# Patient Record
Sex: Male | Born: 1975 | Race: White | Hispanic: No | Marital: Single | Smoking: Never smoker
Health system: Southern US, Community
[De-identification: ages and names within clinical notes are randomized; demographics above are authoritative.]

## PROBLEM LIST (undated history)

## (undated) DIAGNOSIS — J45909 Unspecified asthma, uncomplicated: Secondary | ICD-10-CM

---

## 2016-12-22 DIAGNOSIS — J45901 Unspecified asthma with (acute) exacerbation: Secondary | ICD-10-CM

## 2016-12-22 DIAGNOSIS — E876 Hypokalemia: Secondary | ICD-10-CM

## 2016-12-22 DIAGNOSIS — R0600 Dyspnea, unspecified: Secondary | ICD-10-CM

## 2016-12-22 DIAGNOSIS — R0902 Hypoxemia: Secondary | ICD-10-CM

## 2017-09-14 DIAGNOSIS — J9601 Acute respiratory failure with hypoxia: Secondary | ICD-10-CM

## 2017-09-14 DIAGNOSIS — J45901 Unspecified asthma with (acute) exacerbation: Secondary | ICD-10-CM

## 2017-09-15 DIAGNOSIS — Z720 Tobacco use: Secondary | ICD-10-CM

## 2017-10-15 ENCOUNTER — Emergency Department (HOSPITAL_COMMUNITY): Payer: Self-pay

## 2017-10-15 ENCOUNTER — Encounter (HOSPITAL_COMMUNITY): Payer: Self-pay | Admitting: Emergency Medicine

## 2017-10-15 ENCOUNTER — Inpatient Hospital Stay (HOSPITAL_COMMUNITY)
Admission: EM | Admit: 2017-10-15 | Discharge: 2017-10-17 | DRG: 202 | Disposition: A | Discharge status: Home / Self Care | Payer: Self-pay | Attending: Family Medicine | Admitting: Family Medicine

## 2017-10-15 ENCOUNTER — Other Ambulatory Visit: Payer: Self-pay

## 2017-10-15 DIAGNOSIS — J069 Acute upper respiratory infection, unspecified: Secondary | ICD-10-CM | POA: Diagnosis present

## 2017-10-15 DIAGNOSIS — E872 Acidosis: Secondary | ICD-10-CM | POA: Diagnosis present

## 2017-10-15 DIAGNOSIS — J9602 Acute respiratory failure with hypercapnia: Secondary | ICD-10-CM | POA: Diagnosis present

## 2017-10-15 DIAGNOSIS — K469 Unspecified abdominal hernia without obstruction or gangrene: Secondary | ICD-10-CM | POA: Diagnosis present

## 2017-10-15 DIAGNOSIS — R000 Tachycardia, unspecified: Secondary | ICD-10-CM | POA: Diagnosis present

## 2017-10-15 DIAGNOSIS — J45901 Unspecified asthma with (acute) exacerbation: Principal | ICD-10-CM | POA: Diagnosis present

## 2017-10-15 DIAGNOSIS — T486X5A Adverse effect of antiasthmatics, initial encounter: Secondary | ICD-10-CM | POA: Diagnosis present

## 2017-10-15 DIAGNOSIS — F1110 Opioid abuse, uncomplicated: Secondary | ICD-10-CM | POA: Diagnosis present

## 2017-10-15 DIAGNOSIS — J9601 Acute respiratory failure with hypoxia: Secondary | ICD-10-CM | POA: Diagnosis present

## 2017-10-15 HISTORY — DX: Unspecified asthma, uncomplicated: J45.909

## 2017-10-15 LAB — CBC WITH DIFFERENTIAL/PLATELET
BASOS ABS: 0.1 10*3/uL (ref 0.0–0.1)
Basophils Relative: 1 %
EOS ABS: 0.8 10*3/uL — AB (ref 0.0–0.7)
EOS PCT: 6 %
HCT: 39.9 % (ref 39.0–52.0)
Hemoglobin: 13.3 g/dL (ref 13.0–17.0)
Lymphocytes Relative: 28 %
Lymphs Abs: 3.8 10*3/uL (ref 0.7–4.0)
MCH: 30.1 pg (ref 26.0–34.0)
MCHC: 33.3 g/dL (ref 30.0–36.0)
MCV: 90.3 fL (ref 78.0–100.0)
MONOS PCT: 7 %
Monocytes Absolute: 1 10*3/uL (ref 0.1–1.0)
Neutro Abs: 7.9 10*3/uL — ABNORMAL HIGH (ref 1.7–7.7)
Neutrophils Relative %: 58 %
PLATELETS: 274 10*3/uL (ref 150–400)
RBC: 4.42 MIL/uL (ref 4.22–5.81)
RDW: 13.1 % (ref 11.5–15.5)
WBC: 13.6 10*3/uL — AB (ref 4.0–10.5)

## 2017-10-15 LAB — RESPIRATORY PANEL BY PCR
Adenovirus: NOT DETECTED
BORDETELLA PERTUSSIS-RVPCR: NOT DETECTED
CORONAVIRUS 229E-RVPPCR: NOT DETECTED
Chlamydophila pneumoniae: NOT DETECTED
Coronavirus HKU1: NOT DETECTED
Coronavirus NL63: NOT DETECTED
Coronavirus OC43: NOT DETECTED
INFLUENZA B-RVPPCR: NOT DETECTED
Influenza A: NOT DETECTED
METAPNEUMOVIRUS-RVPPCR: NOT DETECTED
MYCOPLASMA PNEUMONIAE-RVPPCR: NOT DETECTED
Parainfluenza Virus 1: NOT DETECTED
Parainfluenza Virus 2: NOT DETECTED
Parainfluenza Virus 3: NOT DETECTED
Parainfluenza Virus 4: NOT DETECTED
RESPIRATORY SYNCYTIAL VIRUS-RVPPCR: NOT DETECTED
Rhinovirus / Enterovirus: NOT DETECTED

## 2017-10-15 LAB — BASIC METABOLIC PANEL
Anion gap: 9 (ref 5–15)
Anion gap: 11 (ref 5–15)
BUN: 9 mg/dL (ref 6–20)
BUN: 11 mg/dL (ref 6–20)
CHLORIDE: 101 mmol/L (ref 101–111)
CO2: 27 mmol/L (ref 22–32)
CO2: 26 mmol/L (ref 22–32)
CREATININE: 0.83 mg/dL (ref 0.61–1.24)
Calcium: 8.7 mg/dL — ABNORMAL LOW (ref 8.9–10.3)
Calcium: 8.9 mg/dL (ref 8.9–10.3)
Chloride: 102 mmol/L (ref 101–111)
Creatinine, Ser: 0.9 mg/dL (ref 0.61–1.24)
GFR calc Af Amer: >60 mL/min (ref >60)
GFR calc Af Amer: >60 mL/min (ref >60)
GFR calc non Af Amer: >60 mL/min (ref >60)
GFR calc non Af Amer: >60 mL/min (ref >60)
GLUCOSE: 201 mg/dL — AB (ref 65–99)
GLUCOSE: 187 mg/dL — AB (ref 65–99)
POTASSIUM: 5 mmol/L (ref 3.5–5.1)
POTASSIUM: 3.4 mmol/L — AB (ref 3.5–5.1)
Sodium: 138 mmol/L (ref 135–145)
Sodium: 138 mmol/L (ref 135–145)

## 2017-10-15 LAB — CBC
HEMATOCRIT: 37.4 % — AB (ref 39.0–52.0)
Hemoglobin: 12.3 g/dL — ABNORMAL LOW (ref 13.0–17.0)
MCH: 29.7 pg (ref 26.0–34.0)
MCHC: 32.9 g/dL (ref 30.0–36.0)
MCV: 90.3 fL (ref 78.0–100.0)
Platelets: 202 10*3/uL (ref 150–400)
RBC: 4.14 MIL/uL — ABNORMAL LOW (ref 4.22–5.81)
RDW: 13 % (ref 11.5–15.5)
WBC: 10.3 10*3/uL (ref 4.0–10.5)

## 2017-10-15 LAB — BLOOD GAS, VENOUS
Acid-base deficit: 1.6 mmol/L (ref 0.0–2.0)
BICARBONATE: 30.7 mmol/L — AB (ref 20.0–28.0)
O2 Content: 8 L/min
O2 Saturation: 78 %
PCO2 VEN: 96.7 mmHg — AB (ref 44.0–60.0)
PO2 VEN: 56 mmHg — AB (ref 32.0–45.0)
Patient temperature: 98.6
pH, Ven: 7.128 — CL (ref 7.250–7.430)

## 2017-10-15 LAB — BLOOD GAS, ARTERIAL
ACID-BASE EXCESS: 1.3 mmol/L (ref 0.0–2.0)
BICARBONATE: 26.8 mmol/L (ref 20.0–28.0)
DRAWN BY: 225631
Delivery systems: POSITIVE
Expiratory PAP: 5
FIO2: 28
Inspiratory PAP: 10
O2 SAT: 97.9 %
PATIENT TEMPERATURE: 98.6
PO2 ART: 111 mmHg — AB (ref 83.0–108.0)
pCO2 arterial: 48.3 mmHg — ABNORMAL HIGH (ref 32.0–48.0)
pH, Arterial: 7.363 (ref 7.350–7.450)

## 2017-10-15 LAB — I-STAT CG4 LACTIC ACID, ED: Lactic Acid, Venous: 1.81 mmol/L (ref 0.5–1.9)

## 2017-10-15 LAB — MRSA PCR SCREENING: MRSA BY PCR: NEGATIVE

## 2017-10-15 MED ORDER — ALBUTEROL SULFATE (2.5 MG/3ML) 0.083% IN NEBU
2.5000 mg | INHALATION_SOLUTION | RESPIRATORY_TRACT | Status: DC | PRN
  Filled 2017-10-15: qty 3

## 2017-10-15 MED ORDER — ALBUTEROL (5 MG/ML) CONTINUOUS INHALATION SOLN
20.0000 mg | INHALATION_SOLUTION | Freq: Once | RESPIRATORY_TRACT | Status: AC
  Administered 2017-10-15: 20 mg via RESPIRATORY_TRACT

## 2017-10-15 MED ORDER — ONDANSETRON HCL 4 MG/2ML IJ SOLN
4.0000 mg | Freq: Four times a day (QID) | INTRAMUSCULAR | Status: DC | PRN
  Administered 2017-10-16: 4 mg via INTRAVENOUS
  Filled 2017-10-15: qty 2

## 2017-10-15 MED ORDER — ENOXAPARIN SODIUM 40 MG/0.4ML ~~LOC~~ SOLN
40.0000 mg | SUBCUTANEOUS | Status: DC
  Administered 2017-10-15 – 2017-10-17 (×3): 40 mg via SUBCUTANEOUS
  Filled 2017-10-15 (×3): qty 0.4

## 2017-10-15 MED ORDER — ALBUTEROL (5 MG/ML) CONTINUOUS INHALATION SOLN
INHALATION_SOLUTION | RESPIRATORY_TRACT | Status: AC
  Administered 2017-10-15: 20 mg via RESPIRATORY_TRACT
  Filled 2017-10-15: qty 20

## 2017-10-15 MED ORDER — LORAZEPAM 2 MG/ML IJ SOLN
1.0000 mg | Freq: Once | INTRAMUSCULAR | Status: AC
  Administered 2017-10-15: 1 mg via INTRAVENOUS
  Filled 2017-10-15: qty 1

## 2017-10-15 MED ORDER — BUDESONIDE 0.25 MG/2ML IN SUSP
0.2500 mg | Freq: Two times a day (BID) | RESPIRATORY_TRACT | Status: DC
  Administered 2017-10-15: 0.25 mg via RESPIRATORY_TRACT
  Filled 2017-10-15: qty 2

## 2017-10-15 MED ORDER — IPRATROPIUM-ALBUTEROL 0.5-2.5 (3) MG/3ML IN SOLN
3.0000 mL | Freq: Four times a day (QID) | RESPIRATORY_TRACT | Status: DC
  Administered 2017-10-15 (×2): 3 mL via RESPIRATORY_TRACT
  Filled 2017-10-15 (×2): qty 3

## 2017-10-15 MED ORDER — ACETAMINOPHEN 650 MG RE SUPP: 650.0000 mg | Freq: Four times a day (QID) | RECTAL | Status: DC | PRN

## 2017-10-15 MED ORDER — ALBUTEROL SULFATE (2.5 MG/3ML) 0.083% IN NEBU
2.5000 mg | INHALATION_SOLUTION | RESPIRATORY_TRACT | Status: DC
  Administered 2017-10-15 (×2): 2.5 mg via RESPIRATORY_TRACT
  Filled 2017-10-15: qty 3

## 2017-10-15 MED ORDER — ACETAMINOPHEN 325 MG PO TABS
650.0000 mg | ORAL_TABLET | Freq: Four times a day (QID) | ORAL | Status: DC | PRN
  Administered 2017-10-15: 650 mg via ORAL
  Filled 2017-10-15: qty 2

## 2017-10-15 MED ORDER — ONDANSETRON HCL 4 MG PO TABS: 4.0000 mg | ORAL_TABLET | Freq: Four times a day (QID) | ORAL | Status: DC | PRN

## 2017-10-15 MED ORDER — EPINEPHRINE 0.3 MG/0.3ML IJ SOAJ
0.3000 mg | Freq: Once | INTRAMUSCULAR | Status: AC
Start: 2017-10-15 — End: 2017-10-15
  Administered 2017-10-15: 0.3 mg via INTRAMUSCULAR

## 2017-10-15 MED ORDER — EPINEPHRINE 0.3 MG/0.3ML IJ SOAJ
INTRAMUSCULAR | Status: AC
  Filled 2017-10-15: qty 0.3

## 2017-10-15 MED ORDER — METHYLPREDNISOLONE SODIUM SUCC 40 MG IJ SOLR
40.0000 mg | Freq: Four times a day (QID) | INTRAMUSCULAR | Status: DC
  Administered 2017-10-15 – 2017-10-16 (×4): 40 mg via INTRAVENOUS
  Filled 2017-10-15 (×4): qty 1

## 2017-10-15 MED ORDER — ALBUTEROL (5 MG/ML) CONTINUOUS INHALATION SOLN
INHALATION_SOLUTION | RESPIRATORY_TRACT | Status: AC
  Filled 2017-10-15: qty 20

## 2017-10-15 MED ORDER — IPRATROPIUM-ALBUTEROL 0.5-2.5 (3) MG/3ML IN SOLN
3.0000 mL | Freq: Four times a day (QID) | RESPIRATORY_TRACT | Status: DC
  Administered 2017-10-16 (×2): 3 mL via RESPIRATORY_TRACT
  Filled 2017-10-15 (×2): qty 3

## 2017-10-15 NOTE — Progress Notes (Signed)
Transferred to room 1411 via wheelchair with O2 at 3 l .

## 2017-10-15 NOTE — ED Notes (Signed)
Attempted to call report at set time, nurse is busy and will call back.

## 2017-10-15 NOTE — Progress Notes (Signed)
PROGRESS NOTE  Edward Shaw XBM:[id number] DOB: 10-23-1975 DOA: 10/15/2017 PCP: Patient, No Pcp Per  HPI/Recap of past 24 hours:  Edward Shaw is a 42 y.o. year old male with medical history significant for asthma who presented on 10/15/2017 with worsening shortness of breath over a week and was found to have acute hypoxic/hypercarbic respiratory failure secondary to asthma exacerbation in the setting of presumed viral illness.  Patient was admitted to stepdown to continue noninvasive ventilation with BiPAP.Marland Kitchen    This morning very sleepy but not confused.  Reports symptoms have been going on for the past week.  Has not uses albuterol nebulizer machine at home.  Denies any sick contacts.  Denies any chest pain, abdominal pain, fevers or chills, cough.  Returned in afternoon,still sleepy but much more alert. Endorses improvement in breathing.   Assessment/Plan: Principal Problem:   Acute respiratory failure with hypoxia (HCC) Active Problems:   Asthma exacerbation    Acute hypoxic/hypercarbic respiratory failure secondary to asthma exacerbation, improving ABG on admission was significant for respiratory acidosis (CO2 96.7, pH 7.12).  Patient required duo nebs, magnesium but required transition to BiPAP in Stepdown. Repeat ABG showed improvement in respiratory acidosis.  Presumably because of asthma exacerbation possibly related to viral illness.  No pneumonia on chest x-ray, slight leukocytosis on lab work.  Persistent sinus tachycardia PE in differential however less likely given improvement in respiratory status with scheduled inhalers.  We will continue to monitor -Follow-up RVP, continue IVF steroids for exacerbation, scheduled inhaler regimen (switched to duo-nebs given severity of exacerbtion), supportive care with incentive spirometry, flutter valve. -Expect transfer to floor if the patient not requiring BiPAP  Asthma exacerbation, suspect likely viral prodrome No concern for  pneumonia given negative chest x-ray, no fever, only slight leukocytosis. -We will follow-up RVP, continue supportive care as mentioned above  Sinus tachycardia, improving May be related to beta agonist use for respiratory status. as mentioned above PE thought less likely given improvement in respiratory status with BiPAP and scheduled inhalers.  We will continue to monitor for improvement  Code Status: Full  Family Communication: No Family at bedside Disposition Plan: Plan to transfer care to the floor given no need for BiPAP currently, continue scheduled inhalers and steroids for asthma exacerbation   Consultants:  None  Procedures:  None  Antimicrobials:  None  Cultures:  None  DVT prophylaxis:  Lovenox   Objective: Vitals:   10/15/17 0500 10/15/17 0519 10/15/17 0615 10/15/17 0630  BP: (!) 112/59  101/63 (!) 107/51  Pulse: (!) 111  (!) 120 (!) 123  Resp: 14  18 17   Temp:   98.3 F (36.8 C)   TempSrc:   Oral   SpO2: 100% 98% 98% 96%   No intake or output data in the 24 hours ending 10/15/17 0639 There were no vitals filed for this visit.  Exam:  General: Lying in bed, in no apparent distress Cardiovascular: Tachycardic, no peripheral edema  Respiratory: diffuse end expiratory wheezing, rhonchi, on 2 L,. Able to speak in complete sentences, no accessory muscle use or respiratory distress Abdomen: soft, non-distended, non-tender, normal bowel sounds, no guarding or rebound tenderness Skin: No Rash Neurologic: Grossly no focal neuro deficit.Mental status AAOx3, speech normal, Psychiatric:Appropriate affect, and mood  Data Reviewed: CBC: Recent Labs  Lab 10/15/17 0202 10/15/17 0510  WBC 13.6* 10.3  NEUTROABS 7.9*  --   HGB 13.3 12.3*  HCT 39.9 37.4*  MCV 90.3 90.3  PLT 274 202  Basic Metabolic Panel: Recent Labs  Lab 10/15/17 0202 10/15/17 0510  NA 138 138  K 5.0 3.4*  CL 102 101  CO2 27 26  GLUCOSE 201* 187*  BUN 9 11  CREATININE 0.83  0.90  CALCIUM 8.7* 8.9   GFR: CrCl cannot be calculated (Unknown ideal weight.). Liver Function Tests: No results for input(s): AST, ALT, ALKPHOS, BILITOT, PROT, ALBUMIN in the last 168 hours. No results for input(s): LIPASE, AMYLASE in the last 168 hours. No results for input(s): AMMONIA in the last 168 hours. Coagulation Profile: No results for input(s): INR, PROTIME in the last 168 hours. Cardiac Enzymes: No results for input(s): CKTOTAL, CKMB, CKMBINDEX, TROPONINI in the last 168 hours. BNP (last 3 results) No results for input(s): PROBNP in the last 8760 hours. HbA1C: No results for input(s): HGBA1C in the last 72 hours. CBG: No results for input(s): GLUCAP in the last 168 hours. Lipid Profile: No results for input(s): CHOL, HDL, LDLCALC, TRIG, CHOLHDL, LDLDIRECT in the last 72 hours. Thyroid Function Tests: No results for input(s): TSH, T4TOTAL, FREET4, T3FREE, THYROIDAB in the last 72 hours. Anemia Panel: No results for input(s): VITAMINB12, FOLATE, FERRITIN, TIBC, IRON, RETICCTPCT in the last 72 hours. Urine analysis: No results found for: COLORURINE, APPEARANCEUR, LABSPEC, PHURINE, GLUCOSEU, HGBUR, BILIRUBINUR, KETONESUR, PROTEINUR, UROBILINOGEN, NITRITE, LEUKOCYTESUR Sepsis Labs: @LABRCNTIP (procalcitonin:4,lacticidven:4)  )No results found for this or any previous visit (from the past 240 hour(s)).    Studies: Dg Chest Port 1 View  Result Date: 10/15/2017 CLINICAL DATA:  Asthma and shortness of breath. No relief with albuterol. EXAM: PORTABLE CHEST 1 VIEW COMPARISON:  09/13/2017 FINDINGS: The heart size and mediastinal contours are within normal limits. Both lungs are clear. The visualized skeletal structures are unremarkable. IMPRESSION: No active disease. Electronically Signed   By: Burman NievesWilliam  Stevens M.D.   On: 10/15/2017 02:37    Scheduled Meds: . albuterol  2.5 mg Nebulization Q4H  . budesonide (PULMICORT) nebulizer solution  0.25 mg Nebulization BID  .  enoxaparin (LOVENOX) injection  40 mg Subcutaneous Q24H  . methylPREDNISolone (SOLU-MEDROL) injection  40 mg Intravenous Q6H    Continuous Infusions:   LOS: 0 days     Laverna PeaceShayla D Kindell Strada, MD [facility] Pager [phone]  If 7PM-7AM, please contact night-coverage [email or web address] Password [email or web address] 10/15/2017, 6:39 AM

## 2017-10-15 NOTE — ED Notes (Signed)
ED TO INPATIENT HANDOFF REPORT  Name/Age/Gender Edward Shaw 42 y.o. male  Code Status    Code Status Orders  (From admission, onward)        Start     Ordered   10/15/17 0456  Full code  Continuous     10/15/17 0501    Code Status History    Date Active Date Inactive Code Status Order ID Comments User Context   This patient has a current code status but no historical code status.      Home/SNF/Other Home  Chief Complaint Respiratory Distress  Level of Care/Admitting Diagnosis ED Disposition    ED Disposition Condition Comment   Admit  Hospital Area: [facility] [[id number]]  Level of Care: Stepdown [14]  Admit to SDU based on following criteria: Respiratory Distress:  Frequent assessment and/or intervention to maintain adequate ventilation/respiration, pulmonary toilet, and respiratory treatment.  Diagnosis: Acute respiratory failure with hypoxia [facility]) [[id number]]  Admitting Physician: Rise Patience [phone]  Attending Physician: Rise Patience [phone]  Estimated length of stay: past midnight tomorrow  Certification:: I certify this patient will need inpatient services for at least 2 midnights  PT Class (Do Not Modify): Inpatient [101]  PT Acc Code (Do Not Modify): Private [1]       Medical History Past Medical History:  Diagnosis Date  . Asthma     Allergies No Known Allergies  IV Location/Drains/Wounds Patient Lines/Drains/Airways Status   Active Line/Drains/Airways    Name:   Placement date:   Placement time:   Site:   Days:   Peripheral IV 10/15/17 Left Antecubital   10/15/17    -    Antecubital   less than 1   Peripheral IV 10/15/17 Right Hand   10/15/17    -    Hand   less than 1          Labs/Imaging Results for orders placed or performed during the hospital encounter of 10/15/17 (from the past 48 hour(s))  Blood gas, venous     Status: Abnormal   Collection Time: 10/15/17  2:00 AM  Result Value Ref Range   O2  Content 8.0 L/min   Delivery systems AEROSOL MASK    pH, Ven 7.128 (LL) 7.250 - 7.430    Comment: CRITICAL RESULT CALLED TO, READ BACK BY AND VERIFIED WITH: CHRISTOPHER POLLINA,MD AT 0208 BY AMY RAY RRT RCP ON 10/15/2017    pCO2, Ven 96.7 (HH) 44.0 - 60.0 mmHg    Comment: CRITICAL RESULT CALLED TO, READ BACK BY AND VERIFIED WITH: Joseph Berkshire MD AT 0208 BY AMY RAY RRT RCP ON 10/15/2017    pO2, Ven 56.0 (H) 32.0 - 45.0 mmHg   Bicarbonate 30.7 (H) 20.0 - 28.0 mmol/L   Acid-base deficit 1.6 0.0 - 2.0 mmol/L   O2 Saturation 78.0 %   Patient temperature 98.6    Collection site VEIN    Drawn by [place]    Sample type VENOUS   CBC with Differential/Platelet     Status: Abnormal   Collection Time: 10/15/17  2:02 AM  Result Value Ref Range   WBC 13.6 (H) 4.0 - 10.5 K/uL   RBC 4.42 4.22 - 5.81 MIL/uL   Hemoglobin 13.3 13.0 - 17.0 g/dL   HCT 39.9 39.0 - 52.0 %   MCV 90.3 78.0 - 100.0 fL   MCH 30.1 26.0 - 34.0 pg   MCHC 33.3 30.0 - 36.0 g/dL   RDW 13.1 11.5 - 15.5 %  Platelets 274 150 - 400 K/uL   Neutrophils Relative % 58 %   Lymphocytes Relative 28 %   Lymphs Abs 3.8 0.7 - 4.0 K/uL   Monocytes Relative 7 %   Eosinophils Relative 6 %   Eosinophils Absolute 0.8 (H) 0.0 - 0.7 K/uL   Basophils Relative 1 %   Basophils Absolute 0.1 0.0 - 0.1 K/uL   Neutro Abs 7.9 (H) 1.7 - 7.7 K/uL    Comment: CORRECTED ON 01/28 AT 0246: PREVIOUSLY REPORTED AS 8.0   Monocytes Absolute 1.0 0.1 - 1.0 K/uL    Comment: CORRECTED ON 01/28 AT 0246: PREVIOUSLY REPORTED AS 0.9  Basic metabolic panel     Status: Abnormal   Collection Time: 10/15/17  2:02 AM  Result Value Ref Range   Sodium 138 135 - 145 mmol/L   Potassium 5.0 3.5 - 5.1 mmol/L    Comment: SLIGHT HEMOLYSIS   Chloride 102 101 - 111 mmol/L   CO2 27 22 - 32 mmol/L   Glucose, Bld 201 (H) 65 - 99 mg/dL   BUN 9 6 - 20 mg/dL   Creatinine, Ser 0.83 0.61 - 1.24 mg/dL   Calcium 8.7 (L) 8.9 - 10.3 mg/dL   GFR calc non Af Amer >60  >60 mL/min   GFR calc Af Amer >60 >60 mL/min    Comment: (NOTE) The eGFR has been calculated using the CKD EPI equation. This calculation has not been validated in all clinical situations. eGFR's persistently <60 mL/min signify possible Chronic Kidney Disease.    Anion gap 9 5 - 15  I-Stat CG4 Lactic Acid, ED     Status: None   Collection Time: 10/15/17  2:14 AM  Result Value Ref Range   Lactic Acid, Venous 1.81 0.5 - 1.9 mmol/L  Blood gas, arterial     Status: Abnormal   Collection Time: 10/15/17  3:18 AM  Result Value Ref Range   FIO2 28.00    Delivery systems BILEVEL POSITIVE AIRWAY PRESSURE    Inspiratory PAP 10    Expiratory PAP 5    pH, Arterial 7.363 7.350 - 7.450   pCO2 arterial 48.3 (H) 32.0 - 48.0 mmHg   pO2, Arterial 111 (H) 83.0 - 108.0 mmHg   Bicarbonate 26.8 20.0 - 28.0 mmol/L   Acid-Base Excess 1.3 0.0 - 2.0 mmol/L   O2 Saturation 97.9 %   Patient temperature 98.6    Collection site LEFT RADIAL    Drawn by [id number]    Sample type ARTERIAL    Allens test (pass/fail) PASS PASS   Dg Chest Port 1 View  Result Date: 10/15/2017 CLINICAL DATA:  Asthma and shortness of breath. No relief with albuterol. EXAM: PORTABLE CHEST 1 VIEW COMPARISON:  09/13/2017 FINDINGS: The heart size and mediastinal contours are within normal limits. Both lungs are clear. The visualized skeletal structures are unremarkable. IMPRESSION: No active disease. Electronically Signed   By: Lucienne Capers M.D.   On: 10/15/2017 02:37    Pending Labs Unresulted Labs (From admission, onward)   Start     Ordered   10/22/17 0500  Creatinine, serum  (enoxaparin (LOVENOX)    CrCl >/= 30 ml/min)  Weekly,   R    Comments:  while on enoxaparin therapy    10/15/17 0501   10/15/17 6659  Basic metabolic panel  Tomorrow morning,   R     10/15/17 0501   10/15/17 0500  CBC  Tomorrow morning,   R     10/15/17 0501  10/15/17 0457  Respiratory Panel by PCR  (Respiratory virus panel)  Once,   R     10/15/17  0501   10/15/17 0455  HIV antibody (Routine Testing)  Once,   R     10/15/17 0501      Vitals/Pain Today's Vitals   10/15/17 0330 10/15/17 0342 10/15/17 0400 10/15/17 0430  BP: 131/81 131/81 129/70 110/65  Pulse: (!) 126 (!) 119 (!) 101 (!) 120  Resp: '16 17 14 13  '$ SpO2: 100% 100% 99% 100%    Isolation Precautions Droplet precaution  Medications Medications  acetaminophen (TYLENOL) tablet 650 mg (not administered)    Or  acetaminophen (TYLENOL) suppository 650 mg (not administered)  ondansetron (ZOFRAN) tablet 4 mg (not administered)    Or  ondansetron (ZOFRAN) injection 4 mg (not administered)  enoxaparin (LOVENOX) injection 40 mg (not administered)  albuterol (PROVENTIL) (2.5 MG/3ML) 0.083% nebulizer solution 2.5 mg (2.5 mg Nebulization Given 10/15/17 0519)  albuterol (PROVENTIL) (2.5 MG/3ML) 0.083% nebulizer solution 2.5 mg (not administered)  methylPREDNISolone sodium succinate (SOLU-MEDROL) 40 mg/mL injection 40 mg (not administered)  budesonide (PULMICORT) nebulizer solution 0.25 mg (not administered)  EPINEPHrine (EPI-PEN) injection 0.3 mg (0.3 mg Intramuscular Given 10/15/17 0148)  albuterol (PROVENTIL,VENTOLIN) solution continuous neb (20 mg Nebulization Given 10/15/17 0208)  LORazepam (ATIVAN) injection 1 mg (1 mg Intravenous Given 10/15/17 0228)    Mobility walks

## 2017-10-15 NOTE — H&P (Signed)
History and Physical    Edward Shaw ZOX:[id number] DOB: [birth year] DOA: 10/15/2017  PCP: Patient, No Pcp Per  Patient coming from: Home.  Chief Complaint: Shortness of breath.  HPI: Edward Shaw is a 42 y.o. male with history of asthma uses albuterol inhaler as needed started experiencing worsening shortness of breath since last 48 hours.  Denies any chest pain or fever or chills have been asked of a productive cough.  Denies any sick contacts.  Patient works for [employer].  Since shortness of breath became acutely worse patient came to the ER.  ED Course: In the ER patient was found to be acutely short of breath diffusely wheezing.  On the road to the ER EMS had given patient nebulizer and magnesium sulfate.  Patient has been placed on BiPAP.  Initial ABG showed severe respiratory acidosis which improved significantly to the point that BiPAP was removed and patient is able to talk at this time.  Still wheezing and will be admitted for further management of acute respiratory failure secondary to asthma.  Review of Systems: As per HPI, rest all negative.   Past Medical History:  Diagnosis Date  . Asthma     History reviewed. No pertinent surgical history.   reports that  has never smoked. he has never used smokeless tobacco. He reports that he uses drugs. He reports that he does not drink alcohol.  No Known Allergies  Family History  Problem Relation Age of Onset  . Asthma Neg Hx     Prior to Admission medications   Medication Sig Start Date End Date Taking? Authorizing Provider  acetaminophen (TYLENOL) 500 MG tablet Take 1,000 mg by mouth every 6 (six) hours as needed for mild pain, moderate pain, fever or headache.   Yes [provider]  albuterol (PROVENTIL HFA;VENTOLIN HFA) 108 (90 Base) MCG/ACT inhaler Inhale 2 puffs into the lungs every 6 (six) hours as needed for wheezing or shortness of breath.   Yes [provider]    Physical  Exam: Vitals:   10/15/17 0330 10/15/17 0342 10/15/17 0400 10/15/17 0430  BP: 131/81 131/81 129/70 110/65  Pulse: (!) 126 (!) 119 (!) 101 (!) 120  Resp: 16 17 14 13   SpO2: 100% 100% 99% 100%      Constitutional: Moderately built and nourished. Vitals:   10/15/17 0330 10/15/17 0342 10/15/17 0400 10/15/17 0430  BP: 131/81 131/81 129/70 110/65  Pulse: (!) 126 (!) 119 (!) 101 (!) 120  Resp: 16 17 14 13   SpO2: 100% 100% 99% 100%   Eyes: Anicteric no pallor. ENMT: No discharge from the ears eyes nose or mouth. Neck: No JVD appreciated no mass felt. Respiratory: Bilateral expiratory wheeze and no crepitations. Cardiovascular: S1-S2 heard no murmurs appreciated. Abdomen: Soft nontender bowel sounds present. Musculoskeletal: No edema.  No joint effusion. Skin: No rash.  Skin appears warm. Neurologic: Alert awake oriented to time place and person.  Moves all extremities. Psychiatric: Appears normal.  Normal affect.   Labs on Admission: I have personally reviewed following labs and imaging studies  CBC: Recent Labs  Lab 10/15/17 0202  WBC 13.6*  NEUTROABS 7.9*  HGB 13.3  HCT 39.9  MCV 90.3  PLT 274   Basic Metabolic Panel: Recent Labs  Lab 10/15/17 0202  NA 138  K 5.0  CL 102  CO2 27  GLUCOSE 201*  BUN 9  CREATININE 0.83  CALCIUM 8.7*   GFR: CrCl cannot be calculated (Unknown ideal weight.). Liver Function  Tests: No results for input(s): AST, ALT, ALKPHOS, BILITOT, PROT, ALBUMIN in the last 168 hours. No results for input(s): LIPASE, AMYLASE in the last 168 hours. No results for input(s): AMMONIA in the last 168 hours. Coagulation Profile: No results for input(s): INR, PROTIME in the last 168 hours. Cardiac Enzymes: No results for input(s): CKTOTAL, CKMB, CKMBINDEX, TROPONINI in the last 168 hours. BNP (last 3 results) No results for input(s): PROBNP in the last 8760 hours. HbA1C: No results for input(s): HGBA1C in the last 72 hours. CBG: No results for  input(s): GLUCAP in the last 168 hours. Lipid Profile: No results for input(s): CHOL, HDL, LDLCALC, TRIG, CHOLHDL, LDLDIRECT in the last 72 hours. Thyroid Function Tests: No results for input(s): TSH, T4TOTAL, FREET4, T3FREE, THYROIDAB in the last 72 hours. Anemia Panel: No results for input(s): VITAMINB12, FOLATE, FERRITIN, TIBC, IRON, RETICCTPCT in the last 72 hours. Urine analysis: No results found for: COLORURINE, APPEARANCEUR, LABSPEC, PHURINE, GLUCOSEU, HGBUR, BILIRUBINUR, KETONESUR, PROTEINUR, UROBILINOGEN, NITRITE, LEUKOCYTESUR Sepsis Labs: @LABRCNTIP (procalcitonin:4,lacticidven:4) )No results found for this or any previous visit (from the past 240 hour(s)).   Radiological Exams on Admission: Dg Chest Port 1 View  Result Date: 10/15/2017 CLINICAL DATA:  Asthma and shortness of breath. No relief with albuterol. EXAM: PORTABLE CHEST 1 VIEW COMPARISON:  09/13/2017 FINDINGS: The heart size and mediastinal contours are within normal limits. Both lungs are clear. The visualized skeletal structures are unremarkable. IMPRESSION: No active disease. Electronically Signed   By: Burman Nieves M.D.   On: 10/15/2017 02:37    EKG: Independently reviewed.  Sinus tachycardia.  Assessment/Plan Principal Problem:   Acute respiratory failure with hypoxia (HCC) Active Problems:   Asthma exacerbation    1. Acute respiratory failure with hypoxia secondary to asthma exacerbation -patient is improved but still short of breath.  His ABG still shows PCO2 little more than 45 we will keep patient in stepdown for now.  I have placed patient on albuterol nebulizer scheduled and as needed along with Pulmicort and Solu-Medrol IV.  Check respiratory viral panel. 2. Abdominal hernia appears to be nonobstructed.   DVT prophylaxis: Lovenox. Code Status: Full code. Family Communication: Discussed with patient. Disposition Plan: Home. Consults called: None. Admission status: Inpatient.   [name] MD [facility] Pager [phone].  If 7PM-7AM, please contact night-coverage [email or web address] Password [facility]  10/15/2017, 4:57 AM

## 2017-10-15 NOTE — ED Provider Notes (Signed)
[facility]-EMERGENCY DEPT Provider Note   CSN: [id number] Arrival date & time: 10/15/17  0146     History   Chief Complaint Chief Complaint  Patient presents with  . Respiratory Distress    HPI Edward Shaw is a 42 y.o. male.  Patient brought to the emergency department by ambulance for evaluation of shortness of breath.  Patient reports a history of asthma.  He has been sick for the last week or more with cough and congestion.  Tonight, however, his breathing worsens.  He has used his albuterol inhaler multiple times without relief.  EMS reports severe respiratory distress.  He has been administered DuoNeb x2, Solu-Medrol 125 mg IV, magnesium IV without significant improvement. Level V Caveat due to acuity of condition.      Past Medical History:  Diagnosis Date  . Asthma     There are no active problems to display for this patient.   History reviewed. No pertinent surgical history.     Home Medications    Prior to Admission medications   Not on File    Family History History reviewed. No pertinent family history.  Social History Social History   Tobacco Use  . Smoking status: Never Smoker  . Smokeless tobacco: Never Used  Substance Use Topics  . Alcohol use: No    Frequency: Never  . Drug use: Yes     Allergies   Patient has no known allergies.   Review of Systems Review of Systems  Unable to perform ROS: Acuity of condition     Physical Exam Updated Vital Signs BP (!) 141/87   Pulse (!) 128   Resp (!) 26   SpO2 100%   Physical Exam  Constitutional: He is oriented to person, place, and time. He appears well-developed and well-nourished. He appears distressed.  HENT:  Head: Normocephalic and atraumatic.  Right Ear: Hearing normal.  Left Ear: Hearing normal.  Nose: Nose normal.  Mouth/Throat: Oropharynx is clear and moist and mucous membranes are normal.  Eyes: Conjunctivae and EOM are normal. Pupils are  equal, round, and reactive to light.  Neck: Normal range of motion. Neck supple.  Cardiovascular: Regular rhythm, S1 normal and S2 normal. Exam reveals no gallop and no friction rub.  No murmur heard. Pulmonary/Chest: Accessory muscle usage present. Tachypnea noted. He is in respiratory distress. He has decreased breath sounds. He has wheezes. He exhibits no tenderness.  Abdominal: Soft. Normal appearance and bowel sounds are normal. There is no hepatosplenomegaly. There is no tenderness. There is no rebound, no guarding, no tenderness at McBurney's point and negative Murphy's sign. No hernia.  Musculoskeletal: Normal range of motion.  Neurological: He is alert and oriented to person, place, and time. He has normal strength. No cranial nerve deficit or sensory deficit. Coordination normal. GCS eye subscore is 4. GCS verbal subscore is 5. GCS motor subscore is 6.  Skin: Skin is warm and intact. No rash noted. He is diaphoretic. No cyanosis.  Psychiatric: He has a normal mood and affect. His speech is normal and behavior is normal. Thought content normal.  Nursing note and vitals reviewed.    ED Treatments / Results  Labs (all labs ordered are listed, but only abnormal results are displayed) Labs Reviewed  CBC WITH DIFFERENTIAL/PLATELET - Abnormal; Notable for the following components:      Result Value   WBC 13.6 (*)    Eosinophils Absolute 0.8 (*)    Neutro Abs 7.9 (*)  All other components within normal limits  BASIC METABOLIC PANEL - Abnormal; Notable for the following components:   Glucose, Bld 201 (*)    Calcium 8.7 (*)    All other components within normal limits  BLOOD GAS, VENOUS - Abnormal; Notable for the following components:   pH, Ven 7.128 (*)    pCO2, Ven 96.7 (*)    pO2, Ven 56.0 (*)    Bicarbonate 30.7 (*)    All other components within normal limits  BLOOD GAS, ARTERIAL  I-STAT CG4 LACTIC ACID, ED    EKG  EKG Interpretation  Date/Time:  Monday October 15 2017 02:12:12 EST Ventricular Rate:  142 PR Interval:    QRS Duration: 113 QT Interval:  283 QTC Calculation: 435 R Axis:   -34 Text Interpretation:  Sinus tachycardia Borderline IVCD with LAD Confirmed by Gilda Crease [phone]) on 10/15/2017 2:52:40 AM       Radiology Dg Chest Port 1 View  Result Date: 10/15/2017 CLINICAL DATA:  Asthma and shortness of breath. No relief with albuterol. EXAM: PORTABLE CHEST 1 VIEW COMPARISON:  09/13/2017 FINDINGS: The heart size and mediastinal contours are within normal limits. Both lungs are clear. The visualized skeletal structures are unremarkable. IMPRESSION: No active disease. Electronically Signed   By: Burman Nieves M.D.   On: 10/15/2017 02:37    Procedures Procedures (including critical care time)  Medications Ordered in ED Medications  EPINEPHrine (EPI-PEN) injection 0.3 mg (0.3 mg Intramuscular Given 10/15/17 0148)  albuterol (PROVENTIL,VENTOLIN) solution continuous neb (20 mg Nebulization Given 10/15/17 0208)  LORazepam (ATIVAN) injection 1 mg (1 mg Intravenous Given 10/15/17 0228)     Initial Impression / Assessment and Plan / ED Course  I have reviewed the triage vital signs and the nursing notes.  Pertinent labs & imaging results that were available during my care of the patient were reviewed by me and considered in my medical decision making (see chart for details).    Patient presented to the emergency department by ambulance with acute respiratory distress.  Patient reports a history of asthma.  He has been sick with cough and congestion for the last week or more, but tonight significantly worsened.  He had no relief with his inhaler.  EMS administered IV Solu-Medrol, IV magnesium, DuoNeb x2 and patient was still tripoding and in distress at arrival.  Patient had significant bronchospasm present on arrival.  He was administered IM epinephrine.  He was placed on BiPAP.  Initial blood gas showed significant respiratory  acidosis.  Repeat blood gas, however, has significantly normalized.  He is much more comfortable.  Patient will be hospitalized for further management.  CRITICAL CARE Performed by: Gilda Crease   Total critical care time: 35 minutes  Critical care time was exclusive of separately billable procedures and treating other patients.  Critical care was necessary to treat or prevent imminent or life-threatening deterioration.  Critical care was time spent personally by me on the following activities: development of treatment plan with patient and/or surrogate as well as nursing, discussions with consultants, evaluation of patient's response to treatment, examination of patient, obtaining history from patient or surrogate, ordering and performing treatments and interventions, ordering and review of laboratory studies, ordering and review of radiographic studies, pulse oximetry and re-evaluation of patient's condition.  Final Clinical Impressions(s) / ED Diagnoses   Final diagnoses:  Acute respiratory failure with hypoxia and hypercapnia [facility])    ED Discharge Orders    None  Gilda CreasePollina, Brunetta Newingham J, MD 10/15/17 [phone]

## 2017-10-15 NOTE — Progress Notes (Signed)
Droplet precautions d/c per infection prevention RN Diannia RuderKara ,respiratory panel negative.

## 2017-10-15 NOTE — ED Triage Notes (Signed)
[facility] of [facility] and SOB-states increasing SOB with no relief from albuterol. Patient received Mag 2 grams, Solumedrol 125 mg and is currently on his second duoneb.

## 2017-10-16 DIAGNOSIS — J9602 Acute respiratory failure with hypercapnia: Secondary | ICD-10-CM

## 2017-10-16 DIAGNOSIS — F1110 Opioid abuse, uncomplicated: Secondary | ICD-10-CM

## 2017-10-16 LAB — HIV ANTIBODY (ROUTINE TESTING W REFLEX): HIV Screen 4th Generation wRfx: NONREACTIVE

## 2017-10-16 MED ORDER — METHOCARBAMOL 500 MG PO TABS
750.0000 mg | ORAL_TABLET | Freq: Three times a day (TID) | ORAL | Status: DC | PRN
Start: 2017-10-16 — End: 2017-10-17

## 2017-10-16 MED ORDER — BISMUTH SUBSALICYLATE 262 MG PO CHEW
524.0000 mg | CHEWABLE_TABLET | ORAL | Status: DC | PRN
  Administered 2017-10-16: 524 mg via ORAL
  Filled 2017-10-16: qty 2

## 2017-10-16 MED ORDER — PREDNISONE 20 MG PO TABS
40.0000 mg | ORAL_TABLET | Freq: Every day | ORAL | Status: DC
  Administered 2017-10-16 – 2017-10-17 (×2): 40 mg via ORAL
  Filled 2017-10-16 (×2): qty 2

## 2017-10-16 NOTE — Progress Notes (Addendum)
PROGRESS NOTE  Rohail Klees ZOX:[id number] DOB: 1976-02-01 DOA: 10/15/2017 PCP: Patient, No Pcp Per  HPI/Recap of past 24 hours:  Jaren Vanetten is a 42 y.o. year old male with medical history significant for asthma who presented on 10/15/2017 with worsening shortness of breath over a week and was found to have acute hypoxic/hypercarbic respiratory failure secondary to asthma exacerbation in the setting of presumed viral illness.  ABG on admission was significant for respiratory acidosis (CO2 96.7, pH 7.12).  Patient required duo nebs, magnesium but required transition to BiPAP. Repeat ABG showed improvement in respiratory acidosis.      Feels much better this morning.  Admits symptoms are preceded by increased heroin usage.  Assessment/Plan: Principal Problem:   Acute respiratory failure with hypoxia (HCC) Active Problems:   Asthma exacerbation    Acute hypoxic/hypercarbic respiratory failure secondary to asthma exacerbation, improving  Respiratory status much improved with transition from IV steroids to oral prednisone and continue scheduled nebulized treatments initially unclear cause exacerbation with negative chest x-ray and unremarkable RVP.  Further discussion with patient today, he admits to increase heroin usage which he states often worsens his asthma symptoms -DC scheduled nebulizer treatments -Monitor over the next 24 hours on PRN albuterol if maintains normal respiratory status expect DC on 1/30  - supportive care with incentive spirometry, flutter valve.   Asthma exacerbation, likely related to heroin usage Patient has nebulizer machine at home but has not used for several months. -PRN albuterol, continue supportive care as mentioned above  Sinus tachycardia, resolved Initial concern for PE on presentation.  However with resolution, likely related to beta agonist given for asthma exacerbation.  Substance abuse, heroin user, no obvious withdrawal symptoms  currently Admits to multiple attempts to abstain.  Has been to several detox centers.  I offered discussion with our social worker for potential community resources but patient declined.  We had a long discussion about the importance of abstaining from further heroin use was in the setting of his asthma -PRN Robaxin (restless legs/withdrawal), PRN acetaminophen, monitor on telemetry   Code Status: Full  Family Communication: No Family at bedside Disposition Plan: Expect discharge 1/30 if respiratory status remains normal while on home regimen during this monitoring period.   Consultants:  None  Procedures:  None  Antimicrobials:  None  Cultures:  None  DVT prophylaxis:  Lovenox   Objective: Vitals:   10/16/17 0525 10/16/17 0809 10/16/17 1118 10/16/17 1304  BP: 135/83   (!) 105/58  Pulse: 60 (!) 55  77  Resp: (!) 21 18 18 18   Temp: 98.3 F (36.8 C)   98.1 F (36.7 C)  TempSrc: Oral   Oral  SpO2: 95% 94% 98% 98%  Weight:      Height:        Intake/Output Summary (Last 24 hours) at 10/16/2017 1506 Last data filed at 10/16/2017 0200 Gross per 24 hour  Intake 240 ml  Output -  Net 240 ml   Filed Weights   10/15/17 0615  Weight: 81.4 kg (179 lb 7.3 oz)    Exam:  General: Sitting up in bed, resting comfortably  Cardiovascular: Regular rate and rhythm, no appreciable murmurs, no peripheral edema Respiratory: Diffuse rhonchi, expiratory wheezing, on room air, able to speak in full sentences, in no respiratory distress  Data Reviewed: CBC: Recent Labs  Lab 10/15/17 0202 10/15/17 0510  WBC 13.6* 10.3  NEUTROABS 7.9*  --   HGB 13.3 12.3*  HCT 39.9 37.4*  MCV  90.3 90.3  PLT 274 202   Basic Metabolic Panel: Recent Labs  Lab 10/15/17 0202 10/15/17 0510  NA 138 138  K 5.0 3.4*  CL 102 101  CO2 27 26  GLUCOSE 201* 187*  BUN 9 11  CREATININE 0.83 0.90  CALCIUM 8.7* 8.9   GFR: Estimated Creatinine Clearance: 116.2 mL/min (by C-G formula based on  SCr of 0.9 mg/dL). Liver Function Tests: No results for input(s): AST, ALT, ALKPHOS, BILITOT, PROT, ALBUMIN in the last 168 hours. No results for input(s): LIPASE, AMYLASE in the last 168 hours. No results for input(s): AMMONIA in the last 168 hours. Coagulation Profile: No results for input(s): INR, PROTIME in the last 168 hours. Cardiac Enzymes: No results for input(s): CKTOTAL, CKMB, CKMBINDEX, TROPONINI in the last 168 hours. BNP (last 3 results) No results for input(s): PROBNP in the last 8760 hours. HbA1C: No results for input(s): HGBA1C in the last 72 hours. CBG: No results for input(s): GLUCAP in the last 168 hours. Lipid Profile: No results for input(s): CHOL, HDL, LDLCALC, TRIG, CHOLHDL, LDLDIRECT in the last 72 hours. Thyroid Function Tests: No results for input(s): TSH, T4TOTAL, FREET4, T3FREE, THYROIDAB in the last 72 hours. Anemia Panel: No results for input(s): VITAMINB12, FOLATE, FERRITIN, TIBC, IRON, RETICCTPCT in the last 72 hours. Urine analysis: No results found for: COLORURINE, APPEARANCEUR, LABSPEC, PHURINE, GLUCOSEU, HGBUR, BILIRUBINUR, KETONESUR, PROTEINUR, UROBILINOGEN, NITRITE, LEUKOCYTESUR Sepsis Labs: @LABRCNTIP (procalcitonin:4,lacticidven:4)  ) Recent Results (from the past 240 hour(s))  Respiratory Panel by PCR     Status: None   Collection Time: 10/15/17  5:10 AM  Result Value Ref Range Status   Adenovirus NOT DETECTED NOT DETECTED Final   Coronavirus 229E NOT DETECTED NOT DETECTED Final   Coronavirus HKU1 NOT DETECTED NOT DETECTED Final   Coronavirus NL63 NOT DETECTED NOT DETECTED Final   Coronavirus OC43 NOT DETECTED NOT DETECTED Final   Metapneumovirus NOT DETECTED NOT DETECTED Final   Rhinovirus / Enterovirus NOT DETECTED NOT DETECTED Final   Influenza A NOT DETECTED NOT DETECTED Final   Influenza B NOT DETECTED NOT DETECTED Final   Parainfluenza Virus 1 NOT DETECTED NOT DETECTED Final   Parainfluenza Virus 2 NOT DETECTED NOT DETECTED Final     Parainfluenza Virus 3 NOT DETECTED NOT DETECTED Final   Parainfluenza Virus 4 NOT DETECTED NOT DETECTED Final   Respiratory Syncytial Virus NOT DETECTED NOT DETECTED Final   Bordetella pertussis NOT DETECTED NOT DETECTED Final   Chlamydophila pneumoniae NOT DETECTED NOT DETECTED Final   Mycoplasma pneumoniae NOT DETECTED NOT DETECTED Final    Comment: Performed at [facility], [address] [place], [place] [address]  MRSA PCR Screening     Status: None   Collection Time: 10/15/17  2:15 PM  Result Value Ref Range Status   MRSA by PCR NEGATIVE NEGATIVE Final    Comment:        The GeneXpert MRSA Assay (FDA approved for NASAL specimens only), is one component of a comprehensive MRSA colonization surveillance program. It is not intended to diagnose MRSA infection nor to guide or monitor treatment for MRSA infections.       Studies: No results found.  Scheduled Meds: . enoxaparin (LOVENOX) injection  40 mg Subcutaneous Q24H  . predniSONE  40 mg Oral Q breakfast    Continuous Infusions:   LOS: 1 day     Laverna PeaceShayla D Yehudit Fulginiti, MD [facility] Pager [phone]  If 7PM-7AM, please contact night-coverage [email or web address] Password [email or web address] 10/16/2017, 3:06 PM

## 2017-10-16 NOTE — Care Management Note (Signed)
Case Management Note  Patient Details  Name: Edward Shaw MRN: [id number] Date of Birth: [birth year]  Subjective/Objective:41 y/o m admitted w/Acute resp failure. From home. No pcp, no health insurance. Provided w/pcp listing-encouraged [facility]. Provided w/community resources for health insurance-affordable care act, Dep Social services,& oother community resources. Aslo provided w/$4 [employer] med list-patient states he can afford them if on [facility] [facility]. No further CM needs.                    Action/Plan:   Expected Discharge Date:                  Expected Discharge Plan:  Home/Self Care  In-House Referral:     Discharge planning Services  CM Consult, Indigent Health Clinic  Post Acute Care Choice:    Choice offered to:     DME Arranged:    DME Agency:     HH Arranged:    HH Agency:     Status of Service:  In process, will continue to follow  If discussed at Long Length of Stay Meetings, dates discussed:    Additional Comments:  Lanier ClamMahabir, Adolph Clutter, RN 10/16/2017, 12:27 PM

## 2017-10-16 NOTE — Plan of Care (Signed)
  Clinical Measurements: Ability to maintain clinical measurements within normal limits will improve 10/16/2017 2216 - Progressing by Herbert PunAddison, Violette Morneault Y, RN   Health Behavior/Discharge Planning: Ability to manage health-related needs will improve 10/16/2017 2216 - Progressing by Jotham Ahn, Dwana Melenahloe Y, RN

## 2017-10-17 DIAGNOSIS — J4551 Severe persistent asthma with (acute) exacerbation: Secondary | ICD-10-CM

## 2017-10-17 DIAGNOSIS — J9601 Acute respiratory failure with hypoxia: Secondary | ICD-10-CM

## 2017-10-17 MED ORDER — ALBUTEROL SULFATE HFA 108 (90 BASE) MCG/ACT IN AERS
1.0000 | INHALATION_SPRAY | RESPIRATORY_TRACT | Status: DC | PRN
  Filled 2017-10-17: qty 6.7

## 2017-10-17 MED ORDER — PREDNISONE 20 MG PO TABS: 40.0000 mg | ORAL_TABLET | Freq: Every day | ORAL | 0 refills | Status: DC

## 2017-10-17 MED ORDER — ALBUTEROL SULFATE HFA 108 (90 BASE) MCG/ACT IN AERS: 1.0000 | INHALATION_SPRAY | RESPIRATORY_TRACT | 11 refills | Status: DC | PRN

## 2017-10-17 MED ORDER — ALBUTEROL SULFATE (2.5 MG/3ML) 0.083% IN NEBU: 2.5000 mg | INHALATION_SOLUTION | RESPIRATORY_TRACT | 12 refills | Status: DC | PRN

## 2017-10-17 NOTE — Discharge Summary (Signed)
Physician Discharge Summary  Edward Shaw ZOX:[id number] DOB: 19-Oct-1975 DOA: 10/15/2017  PCP: Patient, No Pcp Per  Admit date: 10/15/2017 Discharge date: 10/17/2017  Admitted From: Home  Disposition:  Home   Recommendations for Outpatient Follow-up:  1. Follow up with new PCP when able  Home Health: None  Equipment/Devices: None  Discharge Condition: Good  CODE STATUS: FULL Diet recommendation: Regular  Brief/Interim Summary: Edward Shaw is a 42 yo M with heroin IVDU and asthma intermittent who presents with acute hypoxic and hypercapnic respiratory failure from asthma flare, likely URI trigger.    Asthma Started on IV solumedrol and scheduled bronchodilators.  Within 2 days, was significantly better, able to wean off O2 even with ambulation.  Discharged to complete prednisone burst.  He denied smoking.  IVDU Recommended complete IV drug cessation.   Discharge Diagnoses:  Principal Problem:   Acute respiratory failure with hypoxia [facility]) Active Problems:   Asthma exacerbation    Discharge Instructions  Discharge Instructions    Diet general   Complete by:  As directed    Discharge instructions   Complete by:  As directed    From Dr. Maryfrances Shaw: You were admitted with an asthma flare.   This is treated with prednisone, typically for about 5 days.   Pick up the prescription for prednisone 40 mg (two tablets) once daily in the morning with food for the next 4 days Use albuterol (in your neb, or with the inhaler) as needed if you are short of breath  Arrange to start seeing a primary care doctor, as episodes like this can usually be stopped ahead of time if you start treatment early.   Increase activity slowly   Complete by:  As directed      Allergies as of 10/17/2017   No Known Allergies     Medication List    TAKE these medications   acetaminophen 500 MG tablet Commonly known as:  TYLENOL Take 1,000 mg by mouth every 6 (six) hours as needed for mild pain,  moderate pain, fever or headache.   albuterol 108 (90 Base) MCG/ACT inhaler Commonly known as:  PROVENTIL HFA;VENTOLIN HFA Inhale 1-2 puffs into the lungs every 4 (four) hours as needed for wheezing or shortness of breath (use nebs first inpatient). What changed:    how much to take  when to take this  reasons to take this   albuterol (2.5 MG/3ML) 0.083% nebulizer solution Commonly known as:  PROVENTIL Take 3 mLs (2.5 mg total) by nebulization every 4 (four) hours as needed for wheezing or shortness of breath. What changed:  You were already taking a medication with the same name, and this prescription was added. Make sure you understand how and when to take each.   predniSONE 20 MG tablet Commonly known as:  DELTASONE Take 2 tablets (40 mg total) by mouth daily with breakfast. Start taking on:  10/18/2017      Follow-up Information    [facility]. Schedule an appointment as soon as possible for a visit.   Why:  within 1 week,take discharge paper,& photo ID Contact information: [address] [place] [address] [phone]         No Known Allergies  Consultations:  None   Procedures/Studies: Dg Chest Port 1 View  Result Date: 10/15/2017 CLINICAL DATA:  Asthma and shortness of breath. No relief with albuterol. EXAM: PORTABLE CHEST 1 VIEW COMPARISON:  09/13/2017 FINDINGS: The heart size and mediastinal contours are  within normal limits. Both lungs are clear. The visualized skeletal structures are unremarkable. IMPRESSION: No active disease. Electronically Signed   By: Burman Nieves M.D.   On: 10/15/2017 02:37       Subjective: Feels better.  Still wheezy, but no longer tight in the chest, no dyspnea with exertion.  No fever, sputum, confusion.  Discharge Exam: Vitals:   10/17/17 1444 10/17/17 1453  BP: (!) 144/87   Pulse: 79   Resp: 18   Temp: 98.3 F (36.8 C)   SpO2: 97% 96%   Vitals:   10/16/17  2221 10/17/17 0705 10/17/17 1444 10/17/17 1453  BP: (!) 144/73 112/65 (!) 144/87   Pulse: 64 79 79   Resp: 18 18 18    Temp: 98 F (36.7 C) 98 F (36.7 C) 98.3 F (36.8 C)   TempSrc: Oral Oral Oral   SpO2:  91% 97% 96%  Weight:      Height:        General: Pt is alert, awake, not in acute distress Cardiovascular: RRR, S1/S2 +, no rubs, no gallops Respiratory: Heavy wheezing bilaterally. Abdominal: Soft, NT, ND, bowel sounds + Extremities: no edema, no cyanosis    The results of significant diagnostics from this hospitalization (including imaging, microbiology, ancillary and laboratory) are listed below for reference.     Microbiology: Recent Results (from the past 240 hour(s))  Respiratory Panel by PCR     Status: None   Collection Time: 10/15/17  5:10 AM  Result Value Ref Range Status   Adenovirus NOT DETECTED NOT DETECTED Final   Coronavirus 229E NOT DETECTED NOT DETECTED Final   Coronavirus HKU1 NOT DETECTED NOT DETECTED Final   Coronavirus NL63 NOT DETECTED NOT DETECTED Final   Coronavirus OC43 NOT DETECTED NOT DETECTED Final   Metapneumovirus NOT DETECTED NOT DETECTED Final   Rhinovirus / Enterovirus NOT DETECTED NOT DETECTED Final   Influenza A NOT DETECTED NOT DETECTED Final   Influenza B NOT DETECTED NOT DETECTED Final   Parainfluenza Virus 1 NOT DETECTED NOT DETECTED Final   Parainfluenza Virus 2 NOT DETECTED NOT DETECTED Final   Parainfluenza Virus 3 NOT DETECTED NOT DETECTED Final   Parainfluenza Virus 4 NOT DETECTED NOT DETECTED Final   Respiratory Syncytial Virus NOT DETECTED NOT DETECTED Final   Bordetella pertussis NOT DETECTED NOT DETECTED Final   Chlamydophila pneumoniae NOT DETECTED NOT DETECTED Final   Mycoplasma pneumoniae NOT DETECTED NOT DETECTED Final    Comment: Performed at [facility], [address], [place], [place] [address]  MRSA PCR Screening     Status: None   Collection Time: 10/15/17  2:15 PM  Result Value Ref Range Status    MRSA by PCR NEGATIVE NEGATIVE Final    Comment:        The GeneXpert MRSA Assay (FDA approved for NASAL specimens only), is one component of a comprehensive MRSA colonization surveillance program. It is not intended to diagnose MRSA infection nor to guide or monitor treatment for MRSA infections.      Labs: BNP (last 3 results) No results for input(s): BNP in the last 8760 hours. Basic Metabolic Panel: Recent Labs  Lab 10/15/17 0202 10/15/17 0510  NA 138 138  K 5.0 3.4*  CL 102 101  CO2 27 26  GLUCOSE 201* 187*  BUN 9 11  CREATININE 0.83 0.90  CALCIUM 8.7* 8.9   Liver Function Tests: No results for input(s): AST, ALT, ALKPHOS, BILITOT, PROT, ALBUMIN in the last 168 hours. No  results for input(s): LIPASE, AMYLASE in the last 168 hours. No results for input(s): AMMONIA in the last 168 hours. CBC: Recent Labs  Lab 10/15/17 0202 10/15/17 0510  WBC 13.6* 10.3  NEUTROABS 7.9*  --   HGB 13.3 12.3*  HCT 39.9 37.4*  MCV 90.3 90.3  PLT 274 202   Cardiac Enzymes: No results for input(s): CKTOTAL, CKMB, CKMBINDEX, TROPONINI in the last 168 hours. BNP: Invalid input(s): POCBNP CBG: No results for input(s): GLUCAP in the last 168 hours. D-Dimer No results for input(s): DDIMER in the last 72 hours. Hgb A1c No results for input(s): HGBA1C in the last 72 hours. Lipid Profile No results for input(s): CHOL, HDL, LDLCALC, TRIG, CHOLHDL, LDLDIRECT in the last 72 hours. Thyroid function studies No results for input(s): TSH, T4TOTAL, T3FREE, THYROIDAB in the last 72 hours.  Invalid input(s): FREET3 Anemia work up No results for input(s): VITAMINB12, FOLATE, FERRITIN, TIBC, IRON, RETICCTPCT in the last 72 hours. Urinalysis No results found for: COLORURINE, APPEARANCEUR, LABSPEC, PHURINE, GLUCOSEU, HGBUR, BILIRUBINUR, KETONESUR, PROTEINUR, UROBILINOGEN, NITRITE, LEUKOCYTESUR Sepsis Labs Invalid input(s): PROCALCITONIN,  WBC,  LACTICIDVEN Microbiology Recent Results (from  the past 240 hour(s))  Respiratory Panel by PCR     Status: None   Collection Time: 10/15/17  5:10 AM  Result Value Ref Range Status   Adenovirus NOT DETECTED NOT DETECTED Final   Coronavirus 229E NOT DETECTED NOT DETECTED Final   Coronavirus HKU1 NOT DETECTED NOT DETECTED Final   Coronavirus NL63 NOT DETECTED NOT DETECTED Final   Coronavirus OC43 NOT DETECTED NOT DETECTED Final   Metapneumovirus NOT DETECTED NOT DETECTED Final   Rhinovirus / Enterovirus NOT DETECTED NOT DETECTED Final   Influenza A NOT DETECTED NOT DETECTED Final   Influenza B NOT DETECTED NOT DETECTED Final   Parainfluenza Virus 1 NOT DETECTED NOT DETECTED Final   Parainfluenza Virus 2 NOT DETECTED NOT DETECTED Final   Parainfluenza Virus 3 NOT DETECTED NOT DETECTED Final   Parainfluenza Virus 4 NOT DETECTED NOT DETECTED Final   Respiratory Syncytial Virus NOT DETECTED NOT DETECTED Final   Bordetella pertussis NOT DETECTED NOT DETECTED Final   Chlamydophila pneumoniae NOT DETECTED NOT DETECTED Final   Mycoplasma pneumoniae NOT DETECTED NOT DETECTED Final    Comment: Performed at [facility], [address] [place], [place] [address]  MRSA PCR Screening     Status: None   Collection Time: 10/15/17  2:15 PM  Result Value Ref Range Status   MRSA by PCR NEGATIVE NEGATIVE Final    Comment:        The GeneXpert MRSA Assay (FDA approved for NASAL specimens only), is one component of a comprehensive MRSA colonization surveillance program. It is not intended to diagnose MRSA infection nor to guide or monitor treatment for MRSA infections.      Time coordinating discharge: Less than 30 minutes  SIGNED:   Alberteen Samhristopher P Tymira Horkey, MD  [facility] 10/17/2017, 5:31 PM

## 2017-10-17 NOTE — Care Management Note (Signed)
Case Management Note  Patient Details  Name: Carron CurieDonnie Ray Singleton MRN: [id number] Date of Birth: 03-20-1976  Subjective/Objective: Patient states he can afford his meds. He has neb machine @ home. Patient will make own pcp appt w/[facility]. No further CM needs.                  Action/Plan:d/c home.   Expected Discharge Date:                  Expected Discharge Plan:  Home/Self Care  In-House Referral:     Discharge planning Services  CM Consult, [facility]  Post Acute Care Choice:    Choice offered to:     DME Arranged:    DME Agency:     HH Arranged:    HH Agency:     Status of Service:  Completed, signed off  If discussed at [employer] Length of Stay Meetings, dates discussed:    Additional Comments:  Lanier ClamMahabir, Tikita Mabee, RN 10/17/2017, 3:06 PM

## 2017-12-18 ENCOUNTER — Emergency Department (HOSPITAL_COMMUNITY): Payer: Self-pay

## 2017-12-18 ENCOUNTER — Observation Stay (HOSPITAL_COMMUNITY)
Admission: EM | Admit: 2017-12-18 | Discharge: 2017-12-19 | Disposition: A | Discharge status: Home / Self Care | Payer: Self-pay | Attending: Internal Medicine | Admitting: Internal Medicine

## 2017-12-18 ENCOUNTER — Encounter (HOSPITAL_COMMUNITY): Payer: Self-pay

## 2017-12-18 ENCOUNTER — Other Ambulatory Visit: Payer: Self-pay

## 2017-12-18 DIAGNOSIS — J4521 Mild intermittent asthma with (acute) exacerbation: Principal | ICD-10-CM | POA: Insufficient documentation

## 2017-12-18 DIAGNOSIS — J9601 Acute respiratory failure with hypoxia: Secondary | ICD-10-CM | POA: Insufficient documentation

## 2017-12-18 DIAGNOSIS — Z7952 Long term (current) use of systemic steroids: Secondary | ICD-10-CM | POA: Insufficient documentation

## 2017-12-18 DIAGNOSIS — Z880 Allergy status to penicillin: Secondary | ICD-10-CM | POA: Insufficient documentation

## 2017-12-18 DIAGNOSIS — T380X5A Adverse effect of glucocorticoids and synthetic analogues, initial encounter: Secondary | ICD-10-CM | POA: Insufficient documentation

## 2017-12-18 DIAGNOSIS — E876 Hypokalemia: Secondary | ICD-10-CM | POA: Insufficient documentation

## 2017-12-18 DIAGNOSIS — J45901 Unspecified asthma with (acute) exacerbation: Secondary | ICD-10-CM | POA: Diagnosis present

## 2017-12-18 DIAGNOSIS — J4522 Mild intermittent asthma with status asthmaticus: Secondary | ICD-10-CM | POA: Insufficient documentation

## 2017-12-18 DIAGNOSIS — D72829 Elevated white blood cell count, unspecified: Secondary | ICD-10-CM | POA: Insufficient documentation

## 2017-12-18 LAB — I-STAT CHEM 8, ED
BUN: 11 mg/dL (ref 6–20)
Calcium, Ion: 1.09 mmol/L — ABNORMAL LOW (ref 1.15–1.40)
Chloride: 101 mmol/L (ref 101–111)
Creatinine, Ser: 0.7 mg/dL (ref 0.61–1.24)
Glucose, Bld: 152 mg/dL — ABNORMAL HIGH (ref 65–99)
HCT: 38 % — ABNORMAL LOW (ref 39.0–52.0)
Hemoglobin: 12.9 g/dL — ABNORMAL LOW (ref 13.0–17.0)
Potassium: 3.3 mmol/L — ABNORMAL LOW (ref 3.5–5.1)
Sodium: 142 mmol/L (ref 135–145)
TCO2: 31 mmol/L (ref 22–32)

## 2017-12-18 MED ORDER — ALBUTEROL SULFATE (2.5 MG/3ML) 0.083% IN NEBU
2.5000 mg | INHALATION_SOLUTION | RESPIRATORY_TRACT | Status: DC
  Administered 2017-12-18 – 2017-12-19 (×2): 2.5 mg via RESPIRATORY_TRACT
  Filled 2017-12-18 (×2): qty 3

## 2017-12-18 MED ORDER — BUDESONIDE 180 MCG/ACT IN AEPB: 2.0000 | INHALATION_SPRAY | Freq: Two times a day (BID) | RESPIRATORY_TRACT | Status: DC

## 2017-12-18 MED ORDER — ENOXAPARIN SODIUM 40 MG/0.4ML ~~LOC~~ SOLN
40.0000 mg | SUBCUTANEOUS | Status: DC
  Administered 2017-12-18: 40 mg via SUBCUTANEOUS
  Filled 2017-12-18: qty 0.4

## 2017-12-18 MED ORDER — BUDESONIDE 0.5 MG/2ML IN SUSP
0.5000 mg | Freq: Two times a day (BID) | RESPIRATORY_TRACT | Status: DC
  Administered 2017-12-18 – 2017-12-19 (×2): 0.5 mg via RESPIRATORY_TRACT
  Filled 2017-12-18 (×2): qty 2

## 2017-12-18 MED ORDER — ALBUTEROL SULFATE (2.5 MG/3ML) 0.083% IN NEBU
2.5000 mg | INHALATION_SOLUTION | Freq: Once | RESPIRATORY_TRACT | Status: AC
Start: 2017-12-18 — End: 2017-12-18
  Administered 2017-12-18: 2.5 mg via RESPIRATORY_TRACT
  Filled 2017-12-18: qty 3

## 2017-12-18 MED ORDER — METHYLPREDNISOLONE SODIUM SUCC 125 MG IJ SOLR
60.0000 mg | Freq: Four times a day (QID) | INTRAMUSCULAR | Status: DC
  Administered 2017-12-18 – 2017-12-19 (×3): 60 mg via INTRAVENOUS
  Filled 2017-12-18 (×3): qty 2

## 2017-12-18 MED ORDER — ALBUTEROL (5 MG/ML) CONTINUOUS INHALATION SOLN
10.0000 mg/h | INHALATION_SOLUTION | RESPIRATORY_TRACT | Status: DC
  Administered 2017-12-18: 10 mg/h via RESPIRATORY_TRACT
  Filled 2017-12-18: qty 20

## 2017-12-18 MED ORDER — IPRATROPIUM BROMIDE 0.02 % IN SOLN
0.5000 mg | Freq: Once | RESPIRATORY_TRACT | Status: AC
  Administered 2017-12-18: 0.5 mg via RESPIRATORY_TRACT
  Filled 2017-12-18: qty 2.5

## 2017-12-18 MED ORDER — POTASSIUM CHLORIDE CRYS ER 20 MEQ PO TBCR
40.0000 meq | EXTENDED_RELEASE_TABLET | Freq: Once | ORAL | Status: AC
  Administered 2017-12-18: 40 meq via ORAL
  Filled 2017-12-18: qty 2

## 2017-12-18 MED ORDER — ALBUTEROL SULFATE (2.5 MG/3ML) 0.083% IN NEBU: 2.5000 mg | INHALATION_SOLUTION | RESPIRATORY_TRACT | Status: DC | PRN

## 2017-12-18 NOTE — H&P (Addendum)
History and Physical    Edward Shaw ZOX:[id number] DOB: 1976-04-30 DOA: 12/18/2017  PCP: Patient, No Pcp Per Patient coming from: Home  Chief Complaint: Shortness of breath  HPI: Edward Shaw is a 42 y.o. 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  He also has a wet cough but not able to bring up any phlegm.  Denies any fever.  Denies any nausea vomiting diarrhea abdominal pain headache changes with his vision or urinary complaints.  When I saw him in the ER he was able to speak to me in full sentences without any difficulty.  He denies smoking.  He was admitted for asthma exacerbation in January 2019.  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  He takes prednisone 20 mg daily and Proventil inhaler at home.    ED Course: He was given nebulizers in the ER.  His saturation was high 80s to low 90s on room air in the ER.  Blood pressure was 133/87 pulse rate 98 respiration 10-16 saturation 100% on 2 L of oxygen.  His potassium was 3.3 sodium 142 BUN 11 creatinine 0.70 hemoglobin 12.9 I do not have a white count on him yet.  Platelets are also pending.  Glucose is 152.  Chest x-ray shows no acute infiltrates.  Review of Systems: As per HPI otherwise all other systems reviewed and are negative   Past Medical History:  Diagnosis Date  . Asthma     History reviewed. No pertinent surgical history.  Social History   Socioeconomic History  . Marital status: Single    Spouse name: Not on file  . Number of children: Not on file  . Years of education: Not on file  . Highest education level: Not on file  Occupational History  . Not on file  Social Needs  . Financial resource strain: Not on file  . Food insecurity:    Worry: Not on file    Inability: Not on file  . Transportation needs:    Medical: Not on file    Non-medical: Not on file  Tobacco Use  . Smoking  status: Never Smoker  . Smokeless tobacco: Never Used  Substance and Sexual Activity  . Alcohol use: No    Frequency: Never  . Drug use: Yes  . Sexual activity: Not Currently  Lifestyle  . Physical activity:    Days per week: Not on file    Minutes per session: Not on file  . Stress: Not on file  Relationships  . Social connections:    Talks on phone: Not on file    Gets together: Not on file    Attends religious service: Not on file    Active member of club or organization: Not on file    Attends meetings of clubs or organizations: Not on file    Relationship status: Not on file  . Intimate partner violence:    Fear of current or ex partner: Not on file    Emotionally abused: Not on file    Physically abused: Not on file    Forced sexual activity: Not on file  Other Topics Concern  . Not on file  Social History Narrative  . Not on file    Allergies  Allergen Reactions  . Penicillins     CHILDHOOD ALLERGY Has patient had a PCN reaction causing immediate rash, facial/tongue/throat swelling, SOB  or lightheadedness with hypotension: {/Unknown Has patient had a PCN reaction causing severe rash involving mucus membranes or skin necrosis: {Unknown: Has patient had a PCN reaction that required hospitalization: Unknown Has patient had a PCN reaction occurring within the last 10 years: Unknown If all of the above answers are "NO", then may proceed with Cephalosporin use.     Family History  Problem Relation Age of Onset  . Asthma Neg Hx      Prior to Admission medications   Medication Sig Start Date End Date Taking? Authorizing Provider  albuterol (PROVENTIL HFA;VENTOLIN HFA) 108 (90 Base) MCG/ACT inhaler Inhale 1-2 puffs into the lungs every 4 (four) hours as needed for wheezing or shortness of breath (use nebs first inpatient). 10/17/17  Yes Danford, Earl Lites, MD  albuterol (PROVENTIL) (2.5 MG/3ML) 0.083% nebulizer solution Take 3 mLs (2.5 mg total) by nebulization  every 4 (four) hours as needed for wheezing or shortness of breath. Patient not taking: Reported on 12/18/2017 10/17/17   Alberteen Sam, MD  predniSONE (DELTASONE) 20 MG tablet Take 2 tablets (40 mg total) by mouth daily with breakfast. Patient not taking: Reported on 12/18/2017 10/18/17   Alberteen Sam, MD    Physical Exam: Vitals:   12/18/17 1300 12/18/17 1330 12/18/17 1400 12/18/17 1430  BP: 130/76 121/82 133/87 113/75  Pulse: 86 97 98 98  Resp: 12 11 10 16   Temp:      TempSrc:      SpO2: 90% 91% 100% 91%     General: Appears calm and comfortable Eyes:  PERRL, EOMI, normal lids, iris ENT:  grossly normal hearing, lips & tongue, mmm Neck:  no LAD, masses or thyromegaly Cardiovascular: RRR, no m/r/g. No LE edema.  Respiratory: Coarse breath sounds bilaterally with wheezing. Abdomen:  soft, ntnd, NABS Skin: no rash or induration seen on limited exam Musculoskeletal:  grossly normal tone BUE/BLE, good ROM, no bony abnormality Psychiatric:  grossly normal mood and affect, speech fluent and appropriate, AOx3 Neurologic:  CN 2-12 grossly intact, moves all extremities in coordinated fashion, sensation intact  Labs on Admission: I have personally reviewed following labs and imaging studies  CBC: Recent Labs  Lab 12/18/17 1124  HGB 12.9*  HCT 38.0*   Basic Metabolic Panel: Recent Labs  Lab 12/18/17 1124  NA 142  K 3.3*  CL 101  GLUCOSE 152*  BUN 11  CREATININE 0.70   GFR: CrCl cannot be calculated (Unknown ideal weight.). Liver Function Tests: No results for input(s): AST, ALT, ALKPHOS, BILITOT, PROT, ALBUMIN in the last 168 hours. No results for input(s): LIPASE, AMYLASE in the last 168 hours. No results for input(s): AMMONIA in the last 168 hours. Coagulation Profile: No results for input(s): INR, PROTIME in the last 168 hours. Cardiac Enzymes: No results for input(s): CKTOTAL, CKMB, CKMBINDEX, TROPONINI in the last 168 hours. BNP (last 3  results) No results for input(s): PROBNP in the last 8760 hours. HbA1C: No results for input(s): HGBA1C in the last 72 hours. CBG: No results for input(s): GLUCAP in the last 168 hours. Lipid Profile: No results for input(s): CHOL, HDL, LDLCALC, TRIG, CHOLHDL, LDLDIRECT in the last 72 hours. Thyroid Function Tests: No results for input(s): TSH, T4TOTAL, FREET4, T3FREE, THYROIDAB in the last 72 hours. Anemia Panel: No results for input(s): VITAMINB12, FOLATE, FERRITIN, TIBC, IRON, RETICCTPCT in the last 72 hours. Urine analysis: No results found for: COLORURINE, APPEARANCEUR, LABSPEC, PHURINE, GLUCOSEU, HGBUR, BILIRUBINUR, KETONESUR, PROTEINUR, UROBILINOGEN, NITRITE, LEUKOCYTESUR  Creatinine Clearance: CrCl cannot  be calculated (Unknown ideal weight.).  Sepsis Labs: @LABRCNTIP (procalcitonin:4,lacticidven:4) )No results found for this or any previous visit (from the past 240 hour(s)).   Radiological Exams on Admission: Dg Chest 2 View  Result Date: 12/18/2017 CLINICAL DATA:  Shortness of breath with cough and congestion EXAM: CHEST - 2 VIEW COMPARISON:  October 15, 2017 FINDINGS: Lungs are clear. Heart size and pulmonary vascularity are normal. No adenopathy. No bone lesions. IMPRESSION: No edema or consolidation. Electronically Signed   By: Bretta BangWilliam  Woodruff III M.D.   On: 12/18/2017 13:33    EKG: Independently reviewed.   Assessment/Plan Active Problems:   Asthma exacerbation    1] acute hypoxic respiratory failure secondary to asthma exacerbation-patient presented with complaints of shortness of breath and wheezing and was found to be hypoxic in the ER.  He was given nebulizers.  His chest x-ray is clear.  He will be admitted to the floor and treated with IV Solu-Medrol standing dose of nebulizers and as needed nebulizers.  2] hypokalemia replete and recheck.  Possibly related to increased frequency of using albuterol.    DVT prophylaxis: Lovenox Code Status: Full  code Family Communication: No family available Disposition Plan: TBD Consults called: None  Admission status: Observation   Alwyn RenElizabeth G Mathews MD [facility]  If 7PM-7AM, please contact night-coverage [email or web address] Password [email or web address]  12/18/2017, 2:49 PM

## 2017-12-18 NOTE — ED Notes (Signed)
ED TO INPATIENT HANDOFF REPORT  Name/Age/Gender Edward Shaw 42 y.o. male  Code Status    Code Status Orders  (From admission, onward)        Start     Ordered   12/18/17 1448  Full code  Continuous     12/18/17 1448    Code Status History    Date Active Date Inactive Code Status Order ID Comments User Context   10/15/2017 0502 10/17/2017 2156 Full Code [id number]  Edward Clos, MD ED      Home/SNF/Other Home  Chief Complaint asthma / SOB   Level of Care/Admitting Diagnosis ED Disposition    ED Disposition Condition Comment   Admit  Hospital Area: [facility] [100102]  Level of Care: Med-Surg [16]  Diagnosis: Asthma exacerbation [409811]  Admitting Physician: Alwyn Ren [[id number]]  Attending Physician: Alwyn Ren [[id number]]  PT Class (Do Not Modify): Observation [104]  PT Acc Code (Do Not Modify): Observation [10022]       Medical History Past Medical History:  Diagnosis Date  . Asthma     Allergies Allergies  Allergen Reactions  . Penicillins     CHILDHOOD ALLERGY Has patient had a PCN reaction causing immediate rash, facial/tongue/throat swelling, SOB or lightheadedness with hypotension: {/Unknown Has patient had a PCN reaction causing severe rash involving mucus membranes or skin necrosis: {Unknown: Has patient had a PCN reaction that required hospitalization: Unknown Has patient had a PCN reaction occurring within the last 10 years: Unknown If all of the above answers are "NO", then may proceed with Cephalosporin use.     IV Location/Drains/Wounds Patient Lines/Drains/Airways Status   Active Line/Drains/Airways    Name:   Placement date:   Placement time:   Site:   Days:   Peripheral IV 10/15/17 Left Antecubital   10/15/17    -    Antecubital   65   Peripheral IV 10/15/17 Right Hand   10/15/17    -    Hand   64   Peripheral IV 12/18/17 Left Hand   12/18/17    1041    Hand   less than 1           Labs/Imaging Results for orders placed or performed during the hospital encounter of 12/18/17 (from the past 48 hour(s))  I-Stat Chem 8, ED     Status: Abnormal   Collection Time: 12/18/17 11:24 AM  Result Value Ref Range   Sodium 142 135 - 145 mmol/L   Potassium 3.3 (L) 3.5 - 5.1 mmol/L   Chloride 101 101 - 111 mmol/L   BUN 11 6 - 20 mg/dL   Creatinine, Ser 6.57 0.61 - 1.24 mg/dL   Glucose, Bld 846 (H) 65 - 99 mg/dL   Calcium, Ion 9.62 (L) 1.15 - 1.40 mmol/L   TCO2 31 22 - 32 mmol/L   Hemoglobin 12.9 (L) 13.0 - 17.0 g/dL   HCT 95.2 (L) 84.1 - 32.4 %   Dg Chest 2 View  Result Date: 12/18/2017 CLINICAL DATA:  Shortness of breath with cough and congestion EXAM: CHEST - 2 VIEW COMPARISON:  October 15, 2017 FINDINGS: Lungs are clear. Heart size and pulmonary vascularity are normal. No adenopathy. No bone lesions. IMPRESSION: No edema or consolidation. Electronically Signed   By: Bretta Bang III M.D.   On: 12/18/2017 13:33    Pending Labs Unresulted Labs (From admission, onward)   Start     Ordered   12/19/17 0500  Comprehensive  metabolic panel  Tomorrow morning,   R     12/18/17 1448   12/19/17 0500  CBC  Tomorrow morning,   R     12/18/17 1448      Vitals/Pain Today's Vitals   12/18/17 1330 12/18/17 1400 12/18/17 1430 12/18/17 1500  BP: 121/82 133/87 113/75 125/75  Pulse: 97 98 98 91  Resp: 11 10 16 12   Temp:      TempSrc:      SpO2: 91% 100% 91% 97%    Isolation Precautions No active isolations  Medications Medications  albuterol (PROVENTIL,VENTOLIN) solution continuous neb (0 mg/hr Nebulization Stopped 12/18/17 1456)  enoxaparin (LOVENOX) injection 40 mg (40 mg Subcutaneous Given 12/18/17 1516)  methylPREDNISolone sodium succinate (SOLU-MEDROL) 125 mg/2 mL injection 60 mg (60 mg Intravenous Given 12/18/17 1504)  albuterol (PROVENTIL) (2.5 MG/3ML) 0.083% nebulizer solution 2.5 mg (has no administration in time range)  albuterol (PROVENTIL) (2.5 MG/3ML) 0.083%  nebulizer solution 2.5 mg (has no administration in time range)  budesonide (PULMICORT) nebulizer solution 0.5 mg (has no administration in time range)  ipratropium (ATROVENT) nebulizer solution 0.5 mg (0.5 mg Nebulization Given 12/18/17 1108)  albuterol (PROVENTIL) (2.5 MG/3ML) 0.083% nebulizer solution 2.5 mg (2.5 mg Nebulization Given 12/18/17 1323)  potassium chloride SA (K-DUR,KLOR-CON) CR tablet 40 mEq (40 mEq Oral Given 12/18/17 1516)    Mobility walks

## 2017-12-18 NOTE — ED Triage Notes (Signed)
Patient arrived via [facility] from work. Patient had asthma attack at work. Patient c/o of feeling short of breath last night. Patient had audible wheezng from across the room. Patient has had 1 duo neb (5mg  albuterol and 0.5 of Atrovent). 125 solumedrol and 2 g of Mag sulfate given per EMS. 20 g in left hand. A/OX4.

## 2017-12-18 NOTE — ED Notes (Signed)
Called respiratory therapist about continuous neb treatment.

## 2017-12-18 NOTE — ED Notes (Signed)
Bed: [id number] Expected date:  Expected time:  Means of arrival:  Comments: resp distress

## 2017-12-18 NOTE — ED Notes (Addendum)
No purple man popped up. Visual merchandiserCalled secretary and gave report to GrenadaBrittany, Charity fundraiserN 5-E.

## 2017-12-18 NOTE — ED Provider Notes (Signed)
[facility]-EMERGENCY DEPT Provider Note   CSN: [id number] Arrival date & time: 12/18/17  1028     History   Chief Complaint Chief Complaint  Patient presents with  . Asthma  . Shortness of Breath    HPI Edward Shaw is a 42 y.o. male.  HPI   42 year old male with shortness of breath and wheezing.  Past history of asthma.  Reports that began feeling short of breath, and having a nonproductive cough wheezing yesterday.  Progressive since then.  Minimal relief with albuterol inhaler.  No fevers or chills.  Denies any acute pain.  No unusual leg pain or swelling.  He received 5 mg of albuterol, 0.5 mg of ipratropium, 25 mg of Solu-Medrol and 2 g of magnesium by EMS prior to arrival. Past Medical History:  Diagnosis Date  . Asthma     Patient Active Problem List   Diagnosis Date Noted  . Acute respiratory failure with hypoxia (HCC) 10/15/2017  . Asthma exacerbation 10/15/2017    No past surgical history on file.      Home Medications    Prior to Admission medications   Medication Sig Start Date End Date Taking? Authorizing Provider  albuterol (PROVENTIL HFA;VENTOLIN HFA) 108 (90 Base) MCG/ACT inhaler Inhale 1-2 puffs into the lungs every 4 (four) hours as needed for wheezing or shortness of breath (use nebs first inpatient). 10/17/17  Yes Danford, Earl Lites, MD  albuterol (PROVENTIL) (2.5 MG/3ML) 0.083% nebulizer solution Take 3 mLs (2.5 mg total) by nebulization every 4 (four) hours as needed for wheezing or shortness of breath. Patient not taking: Reported on 12/18/2017 10/17/17   Alberteen Sam, MD  predniSONE (DELTASONE) 20 MG tablet Take 2 tablets (40 mg total) by mouth daily with breakfast. Patient not taking: Reported on 12/18/2017 10/18/17   Alberteen Sam, MD    Family History Family History  Problem Relation Age of Onset  . Asthma Neg Hx     Social History Social History   Tobacco Use  . Smoking status: Never Smoker   . Smokeless tobacco: Never Used  Substance Use Topics  . Alcohol use: No    Frequency: Never  . Drug use: Yes     Allergies   Penicillins   Review of Systems Review of Systems  All systems reviewed and negative, other than as noted in HPI. Physical Exam Updated Vital Signs BP 128/78   Pulse 85   Temp 97.8 F (36.6 C) (Axillary)   Resp 19   SpO2 96%   Physical Exam  Constitutional: He appears well-developed and well-nourished. He appears distressed.  Sitting straight up in bed. Increased WOB.   HENT:  Head: Normocephalic and atraumatic.  Eyes: Conjunctivae are normal. Right eye exhibits no discharge. Left eye exhibits no discharge.  Neck: Neck supple.  Cardiovascular: Regular rhythm and normal heart sounds. Exam reveals no gallop and no friction rub.  No murmur heard. tachycardic  Pulmonary/Chest: He is in respiratory distress. He has wheezes.  Accessory muscle usage. Speaks in short phrases.   Abdominal: Soft. He exhibits no distension. There is no tenderness.  Musculoskeletal: He exhibits no edema or tenderness.  Lower extremities symmetric as compared to each other. No calf tenderness. Negative Homan's. No palpable cords.   Neurological: He is alert.  Skin: Skin is warm and dry.  Psychiatric: He has a normal mood and affect. His behavior is normal. Thought content normal.  Nursing note and vitals reviewed.    ED Treatments /  Results  Labs (all labs ordered are listed, but only abnormal results are displayed) Labs Reviewed  COMPREHENSIVE METABOLIC PANEL - Abnormal; Notable for the following components:      Result Value   Glucose, Bld 149 (*)    ALT 11 (*)    All other components within normal limits  CBC - Abnormal; Notable for the following components:   WBC 14.7 (*)    Hemoglobin 12.9 (*)    All other components within normal limits  I-STAT CHEM 8, ED - Abnormal; Notable for the following components:   Potassium 3.3 (*)    Glucose, Bld 152 (*)     Calcium, Ion 1.09 (*)    Hemoglobin 12.9 (*)    HCT 38.0 (*)    All other components within normal limits    EKG None  Radiology Dg Chest 2 View  Result Date: 12/18/2017 CLINICAL DATA:  Shortness of breath with cough and congestion EXAM: CHEST - 2 VIEW COMPARISON:  October 15, 2017 FINDINGS: Lungs are clear. Heart size and pulmonary vascularity are normal. No adenopathy. No bone lesions. IMPRESSION: No edema or consolidation. Electronically Signed   By: Bretta BangWilliam  Woodruff III M.D.   On: 12/18/2017 13:33    Procedures Procedures (including critical care time)  CRITICAL CARE Performed by: Raeford RazorStephen Minha Fulco Total critical care time: 35 minutes Critical care time was exclusive of separately billable procedures and treating other patients. Critical care was necessary to treat or prevent imminent or life-threatening deterioration. Critical care was time spent personally by me on the following activities: development of treatment plan with patient and/or surrogate as well as nursing, discussions with consultants, evaluation of patient's response to treatment, examination of patient, obtaining history from patient or surrogate, ordering and performing treatments and interventions, ordering and review of laboratory studies, ordering and review of radiographic studies, pulse oximetry and re-evaluation of patient's condition.   Medications Ordered in ED Medications  albuterol (PROVENTIL,VENTOLIN) solution continuous neb (0 mg/hr Nebulization Stopped 12/18/17 1456)  enoxaparin (LOVENOX) injection 40 mg (40 mg Subcutaneous Given 12/18/17 1516)  albuterol (PROVENTIL) (2.5 MG/3ML) 0.083% nebulizer solution 2.5 mg (has no administration in time range)  budesonide (PULMICORT) nebulizer solution 0.5 mg (0.5 mg Nebulization Given 12/19/17 0739)  ipratropium-albuterol (DUONEB) 0.5-2.5 (3) MG/3ML nebulizer solution 3 mL (has no administration in time range)  methylPREDNISolone sodium succinate (SOLU-MEDROL) 40 mg/mL  injection 40 mg (has no administration in time range)  ipratropium (ATROVENT) nebulizer solution 0.5 mg (0.5 mg Nebulization Given 12/18/17 1108)  albuterol (PROVENTIL) (2.5 MG/3ML) 0.083% nebulizer solution 2.5 mg (2.5 mg Nebulization Given 12/18/17 1323)  potassium chloride SA (K-DUR,KLOR-CON) CR tablet 40 mEq (40 mEq Oral Given 12/18/17 1516)     Initial Impression / Assessment and Plan / ED Course  I have reviewed the triage vital signs and the nursing notes.  Pertinent labs & imaging results that were available during my care of the patient were reviewed by me and considered in my medical decision making (see chart for details).  41yM with respiratory distress. Severe wheezing on exam. Afebrile. Not volume overloaded. Doubt PE, CHF, pneumonia. Received multiple nebs and remains with oxygen requirement and increased WOB. Steroids pre-hospital. Will admit for ongoing tx.   Final Clinical Impressions(s) / ED Diagnoses   Final diagnoses:  Intermittent asthma with status asthmaticus, unspecified asthma severity    ED Discharge Orders    None       Raeford RazorKohut, Maurisa Tesmer, MD 12/19/17 [phone]

## 2017-12-19 DIAGNOSIS — J4541 Moderate persistent asthma with (acute) exacerbation: Secondary | ICD-10-CM

## 2017-12-19 DIAGNOSIS — J4522 Mild intermittent asthma with status asthmaticus: Secondary | ICD-10-CM

## 2017-12-19 LAB — COMPREHENSIVE METABOLIC PANEL
ALT: 11 U/L — ABNORMAL LOW (ref 17–63)
AST: 15 U/L (ref 15–41)
Albumin: 3.9 g/dL (ref 3.5–5.0)
Alkaline Phosphatase: 70 U/L (ref 38–126)
Anion gap: 9 (ref 5–15)
BILIRUBIN TOTAL: 0.7 mg/dL (ref 0.3–1.2)
BUN: 14 mg/dL (ref 6–20)
CO2: 25 mmol/L (ref 22–32)
CREATININE: 0.7 mg/dL (ref 0.61–1.24)
Calcium: 9.4 mg/dL (ref 8.9–10.3)
Chloride: 105 mmol/L (ref 101–111)
GFR calc Af Amer: >60 mL/min (ref >60)
Glucose, Bld: 149 mg/dL — ABNORMAL HIGH (ref 65–99)
Potassium: 4.6 mmol/L (ref 3.5–5.1)
Sodium: 139 mmol/L (ref 135–145)
TOTAL PROTEIN: 7.5 g/dL (ref 6.5–8.1)

## 2017-12-19 LAB — CBC
HCT: 40.2 % (ref 39.0–52.0)
Hemoglobin: 12.9 g/dL — ABNORMAL LOW (ref 13.0–17.0)
MCH: 29.3 pg (ref 26.0–34.0)
MCHC: 32.1 g/dL (ref 30.0–36.0)
MCV: 91.2 fL (ref 78.0–100.0)
PLATELETS: 249 10*3/uL (ref 150–400)
RBC: 4.41 MIL/uL (ref 4.22–5.81)
RDW: 13.7 % (ref 11.5–15.5)
WBC: 14.7 10*3/uL — AB (ref 4.0–10.5)

## 2017-12-19 MED ORDER — METHYLPREDNISOLONE SODIUM SUCC 40 MG IJ SOLR
40.0000 mg | Freq: Two times a day (BID) | INTRAMUSCULAR | Status: DC
  Administered 2017-12-19: 40 mg via INTRAVENOUS
  Filled 2017-12-19: qty 1

## 2017-12-19 MED ORDER — GUAIFENESIN-DM 100-10 MG/5ML PO SYRP: 5.0000 mL | ORAL_SOLUTION | ORAL | 0 refills | Status: DC | PRN

## 2017-12-19 MED ORDER — ALBUTEROL SULFATE (2.5 MG/3ML) 0.083% IN NEBU
2.5000 mg | INHALATION_SOLUTION | Freq: Four times a day (QID) | RESPIRATORY_TRACT | Status: DC
  Administered 2017-12-19: 2.5 mg via RESPIRATORY_TRACT
  Filled 2017-12-19: qty 3

## 2017-12-19 MED ORDER — ALBUTEROL SULFATE HFA 108 (90 BASE) MCG/ACT IN AERS: 1.0000 | INHALATION_SPRAY | RESPIRATORY_TRACT | 0 refills | Status: DC | PRN

## 2017-12-19 MED ORDER — IPRATROPIUM-ALBUTEROL 0.5-2.5 (3) MG/3ML IN SOLN: 3.0000 mL | Freq: Four times a day (QID) | RESPIRATORY_TRACT | Status: DC

## 2017-12-19 MED ORDER — PREDNISONE 20 MG PO TABS: 40.0000 mg | ORAL_TABLET | Freq: Every day | ORAL | 0 refills | Status: AC

## 2017-12-19 NOTE — Progress Notes (Signed)
Discharge instructions and medications discussed with patient.  Prescriptions and AVS given to patient. All questions answered.  

## 2017-12-19 NOTE — Progress Notes (Signed)
CM consult for PCP. Pt was [facility] during last admission in January. [facility] info placed on AVS. Sandford Crazeora Harkirat Orozco RN,BSN,NCM [phone]

## 2017-12-19 NOTE — Discharge Summary (Addendum)
Physician Discharge Summary  Edward Shaw ZOX:[id number] DOB: 15-Jul-1976 DOA: 12/18/2017  PCP: Patient, No Pcp Per  Admit date: 12/18/2017 Discharge date: 12/19/2017  Admitted From: Home Disposition:  Home  Discharge Condition: Stable CODE STATUS:Full Diet recommendation:Regular  Brief/Interim Summary: Admission Edward Shaw is a 42 y.o. 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  He also has a wet cough but not able to bring up any phlegm.  Denies any fever.  Denies any nausea vomiting diarrhea abdominal pain headache changes with his vision or urinary complaints.  When I saw him in the ER he was able to speak to me in full sentences without any difficulty.  He denies smoking.  He was admitted for asthma exacerbation in January 2019.  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  He takes prednisone 20 mg daily and Proventil inhaler at home.  ED Course: He was given nebulizers in the ER.  His saturation was high 80s to low 90s on room air in the ER.  Blood pressure was 133/87 pulse rate 98 respiration 10-16 saturation 100% on 2 L of oxygen.  His potassium was 3.3 sodium 142 BUN 11 creatinine 0.70 hemoglobin 12.9 I do not have a white count on him yet.  Platelets are also pending.  Glucose is 152.  Chest x-Edward shows no acute infiltrates.  Hospital Course: Patient was admitted and started on IV steroids.  This morning he was respiratory status was stable.  He is saturating fine on room air.  He has mild wheezes bilaterally on auscultation.  Patient is stable for discharge to home today.  He is hemodynamically stable.  Steroids have been tapered to oral.  He will continue rescue inhaler at home. Given his history, he has intermittent asthma.  We recommend him to stay away from dust.  He needs to follow-up with a primary care physician as an outpatient.  Case manager requested for  setting an appointment.  Following problems were addressed during his hospitalization:  Acute hypoxic respiratory failure: Secondary to asthma exacerbation.  Patient has history of intermittent asthma.  Continue albuterol inhaler at home as needed.Chest x-Edward was clear on presentation.  Hypokalemia: Supplemented and corrected.  Leukocytosis: Most likely secondary to steroids.  Check CBC in a week.    Discharge Diagnoses:  Active Problems:   Asthma exacerbation    Discharge Instructions  Discharge Instructions    Diet - low sodium heart healthy   Complete by:  As directed    Discharge instructions   Complete by:  As directed    1) Please follow up with a PCP as an outpatient. 2) Take prescribed medication as instructed. 3) Avoid dust. 4) Do a CBC test in a week to check white cell counts.   Increase activity slowly   Complete by:  As directed      Allergies as of 12/19/2017      Reactions   Penicillins    CHILDHOOD ALLERGY Has patient had a PCN reaction causing immediate rash, facial/tongue/throat swelling, SOB or lightheadedness with hypotension: {/Unknown Has patient had a PCN reaction causing severe rash involving mucus membranes or skin necrosis: {Unknown: Has patient had a PCN reaction that required hospitalization: Unknown Has patient had a PCN reaction occurring within the last 10 years: Unknown If all of the above answers are "NO", then may proceed with Cephalosporin use.  Medication List    TAKE these medications   albuterol (2.5 MG/3ML) 0.083% nebulizer solution Commonly known as:  PROVENTIL Take 3 mLs (2.5 mg total) by nebulization every 4 (four) hours as needed for wheezing or shortness of breath.   albuterol 108 (90 Base) MCG/ACT inhaler Commonly known as:  PROVENTIL HFA;VENTOLIN HFA Inhale 1-2 puffs into the lungs every 4 (four) hours as needed for wheezing or shortness of breath (use nebs first inpatient).   guaiFENesin-dextromethorphan 100-10  MG/5ML syrup Commonly known as:  ROBITUSSIN DM Take 5 mLs by mouth every 4 (four) hours as needed for cough.   predniSONE 20 MG tablet Commonly known as:  DELTASONE Take 2 tablets (40 mg total) by mouth daily for 5 days. What changed:  when to take this      Follow-up Information    [facility] Follow up.   Why:  Make follow up appointment to establish primary care MD Contact information: [address] [place] [phone]         Allergies  Allergen Reactions  . Penicillins     CHILDHOOD ALLERGY Has patient had a PCN reaction causing immediate rash, facial/tongue/throat swelling, SOB or lightheadedness with hypotension: {/Unknown Has patient had a PCN reaction causing severe rash involving mucus membranes or skin necrosis: {Unknown: Has patient had a PCN reaction that required hospitalization: Unknown Has patient had a PCN reaction occurring within the last 10 years: Unknown If all of the above answers are "NO", then may proceed with Cephalosporin use.     Consultations: None  Procedures/Studies: Dg Chest 2 View  Result Date: 12/18/2017 CLINICAL DATA:  Shortness of breath with cough and congestion EXAM: CHEST - 2 VIEW COMPARISON:  October 15, 2017 FINDINGS: Lungs are clear. Heart size and pulmonary vascularity are normal. No adenopathy. No bone lesions. IMPRESSION: No edema or consolidation. Electronically Signed   By: Bretta BangWilliam  Woodruff III M.D.   On: 12/18/2017 13:33      Subjective: Patient seen and examined the bedside this morning.  Remains stable.  Currently saturating fine on room air.  Mild wheezes bilaterally auscultated .  Stable for discharge to home  Discharge Exam: Vitals:   12/19/17 0502 12/19/17 0739  BP: 106/62   Pulse: 71   Resp: 17   Temp: 97.7 F (36.5 C)   SpO2: 93% 93%   Vitals:   12/18/17 2147 12/19/17 0028 12/19/17 0502 12/19/17 0739  BP: 108/65  106/62   Pulse: (!) 102   71   Resp: 16  17   Temp: 98.1 F (36.7 C)  97.7 F (36.5 C)   TempSrc: Oral  Oral   SpO2: 94% 95% 93% 93%    General: Pt is alert, awake, not in acute distress Cardiovascular: RRR, S1/S2 +, no rubs, no gallops Respiratory: B/L expiratory wheezes Abdominal: Soft, NT, ND, bowel sounds + Extremities: no edema, no cyanosis    The results of significant diagnostics from this hospitalization (including imaging, microbiology, ancillary and laboratory) are listed below for reference.     Microbiology: No results found for this or any previous visit (from the past 240 hour(s)).   Labs: BNP (last 3 results) No results for input(s): BNP in the last 8760 hours. Basic Metabolic Panel: Recent Labs  Lab 12/18/17 1124 12/19/17 0613  NA 142 139  K 3.3* 4.6  CL 101 105  CO2  --  25  GLUCOSE 152* 149*  BUN 11 14  CREATININE 0.70 0.70  CALCIUM  --  9.4   Liver Function Tests: Recent Labs  Lab 12/19/17 0613  AST 15  ALT 11*  ALKPHOS 70  BILITOT 0.7  PROT 7.5  ALBUMIN 3.9   No results for input(s): LIPASE, AMYLASE in the last 168 hours. No results for input(s): AMMONIA in the last 168 hours. CBC: Recent Labs  Lab 12/18/17 1124 12/19/17 0613  WBC  --  14.7*  HGB 12.9* 12.9*  HCT 38.0* 40.2  MCV  --  91.2  PLT  --  249   Cardiac Enzymes: No results for input(s): CKTOTAL, CKMB, CKMBINDEX, TROPONINI in the last 168 hours. BNP: Invalid input(s): POCBNP CBG: No results for input(s): GLUCAP in the last 168 hours. D-Dimer No results for input(s): DDIMER in the last 72 hours. Hgb A1c No results for input(s): HGBA1C in the last 72 hours. Lipid Profile No results for input(s): CHOL, HDL, LDLCALC, TRIG, CHOLHDL, LDLDIRECT in the last 72 hours. Thyroid function studies No results for input(s): TSH, T4TOTAL, T3FREE, THYROIDAB in the last 72 hours.  Invalid input(s): FREET3 Anemia work up No results for input(s): VITAMINB12, FOLATE, FERRITIN, TIBC, IRON, RETICCTPCT in the  last 72 hours. Urinalysis No results found for: COLORURINE, APPEARANCEUR, LABSPEC, PHURINE, GLUCOSEU, HGBUR, BILIRUBINUR, KETONESUR, PROTEINUR, UROBILINOGEN, NITRITE, LEUKOCYTESUR Sepsis Labs Invalid input(s): PROCALCITONIN,  WBC,  LACTICIDVEN Microbiology No results found for this or any previous visit (from the past 240 hour(s)).   Time coordinating discharge: Over 30 minutes  SIGNED:   Burnadette Pop, MD  [facility] 12/19/2017, 12:01 PM Pager [phone]  If 7PM-7AM, please contact night-coverage [email or web address] Password [email or web address]

## 2018-08-06 IMAGING — CR DG CHEST 2V
2 series · 2 of 2 positions shown · non-contrast
Comparison: October 15, 2017

CLINICAL DATA: Shortness of breath with cough and congestion

EXAM:
CHEST - 2 VIEW

[w chest pa]
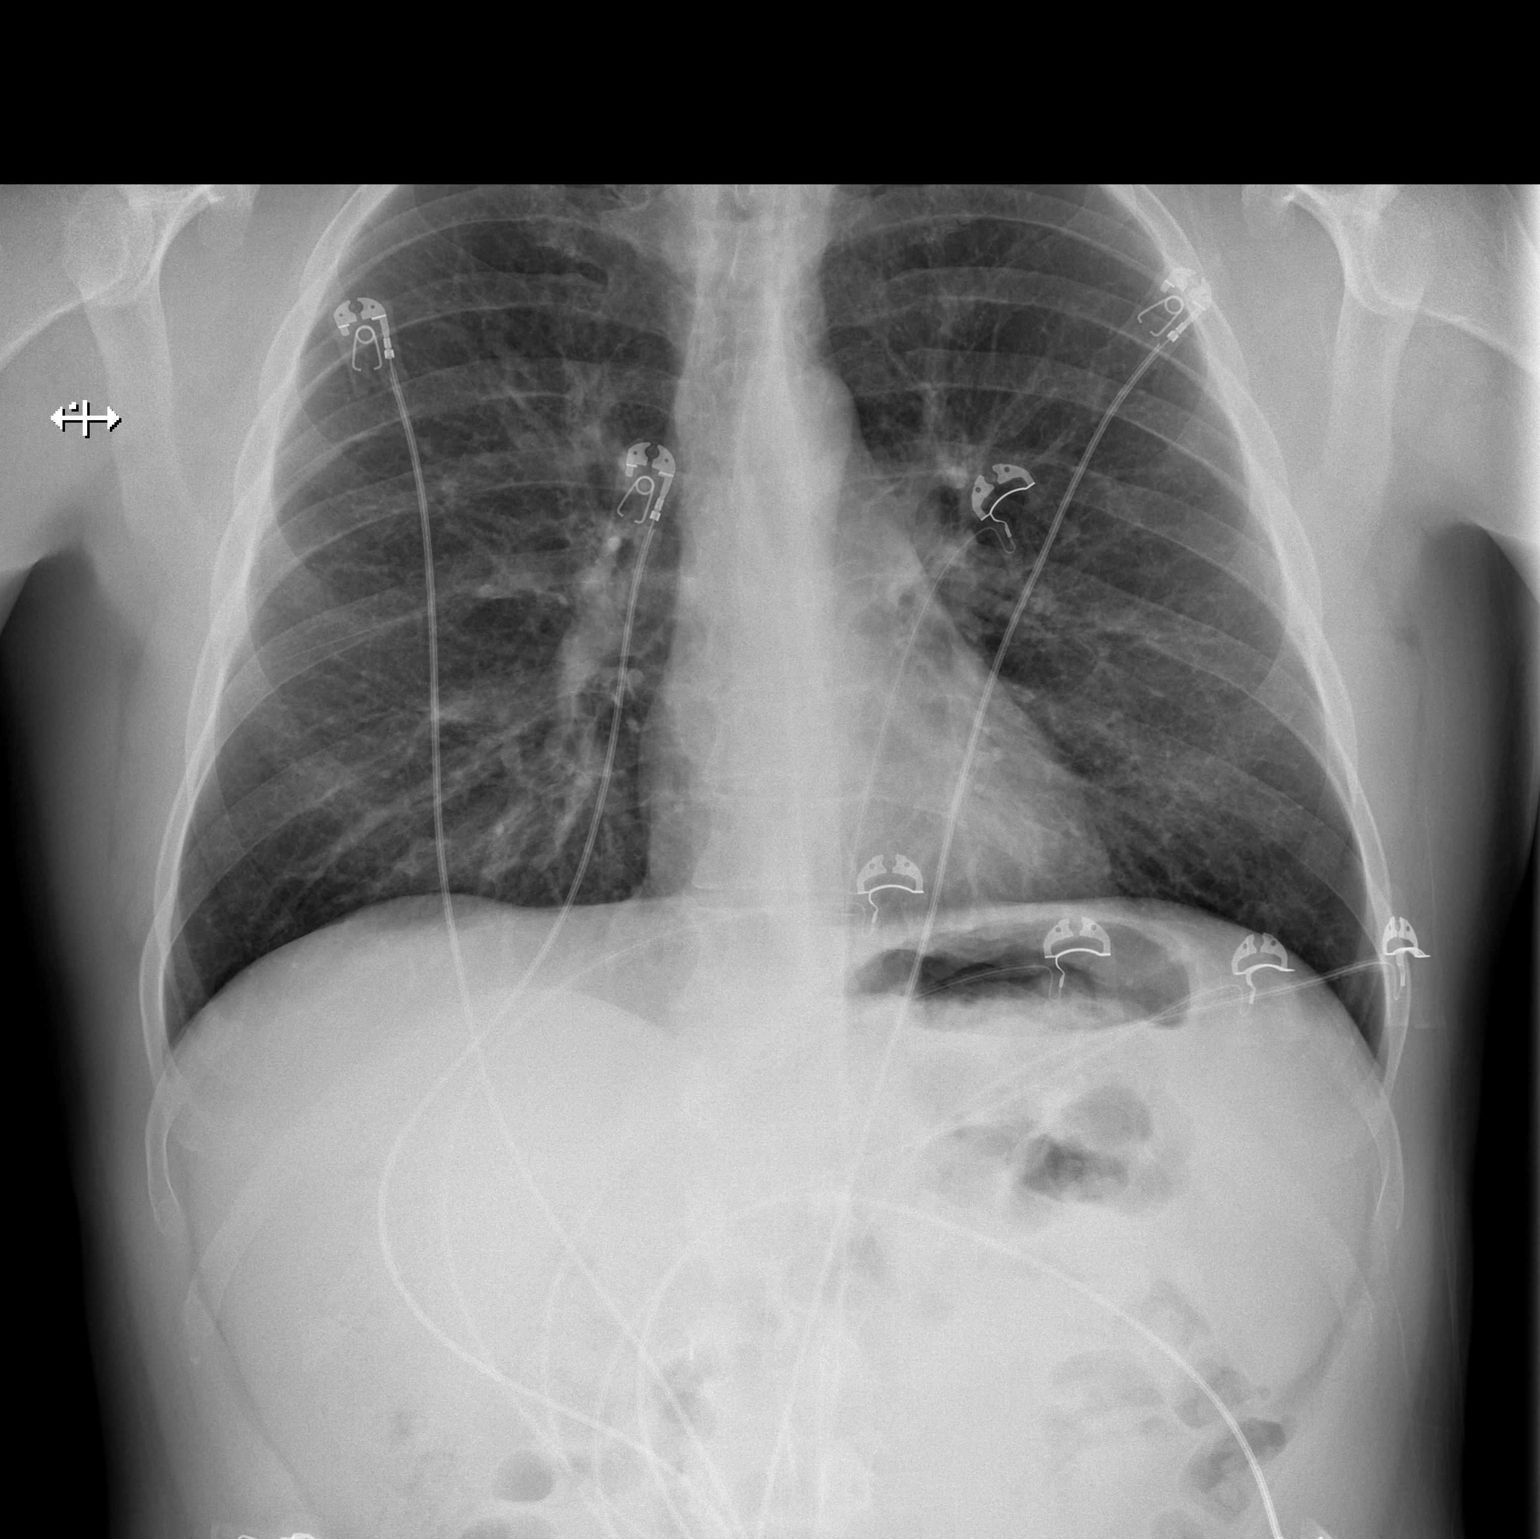

[w chest lat]
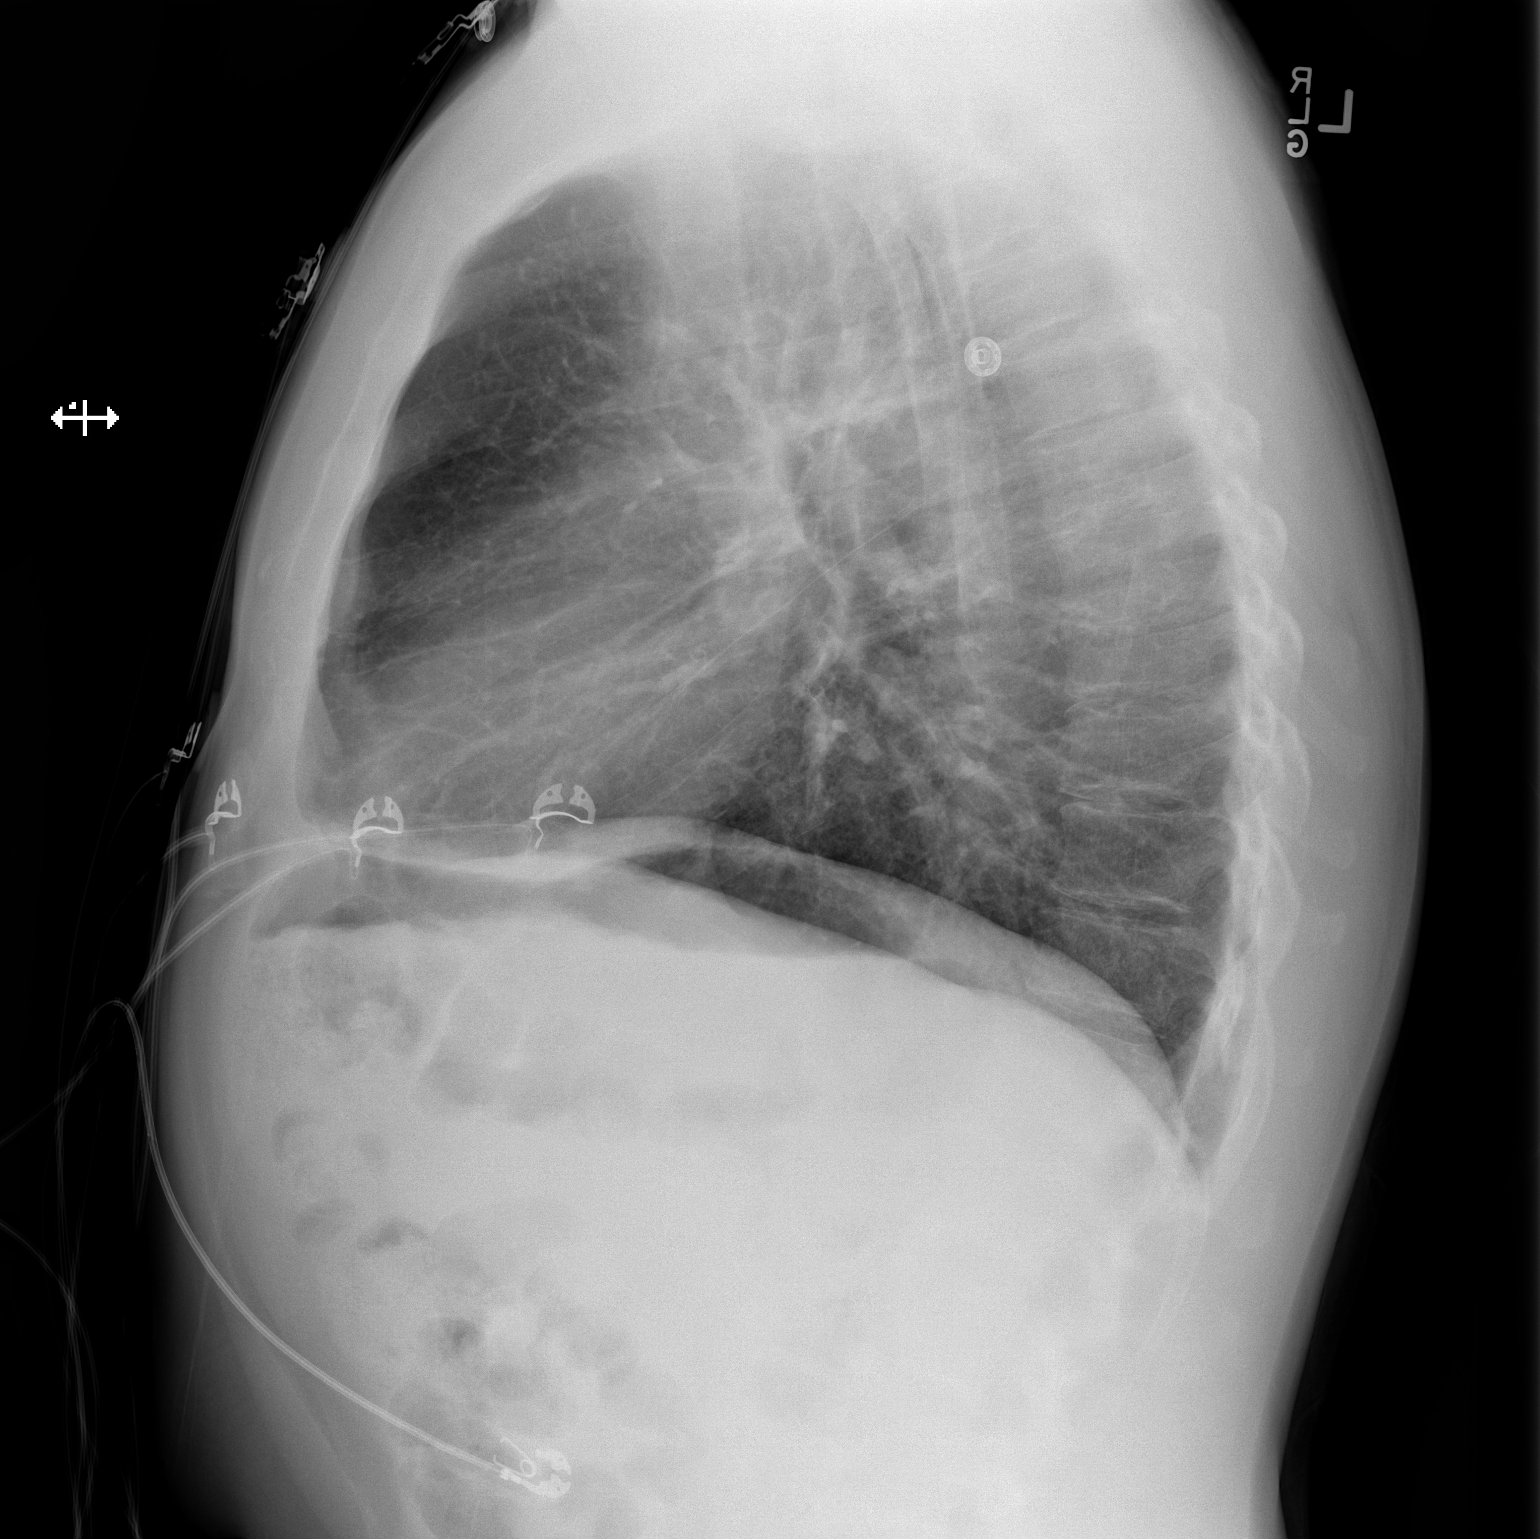

[2 of 2 positions shown; findings below may reference images not displayed]

FINDINGS: Lungs are clear. Heart size and pulmonary vascularity are normal. No
adenopathy. No bone lesions.
IMPRESSION: No edema or consolidation.

## 2018-09-20 ENCOUNTER — Inpatient Hospital Stay (HOSPITAL_COMMUNITY)
Admission: AD | Admit: 2018-09-20 | Discharge: 2018-09-24 | DRG: 885 | Disposition: A | Discharge status: Home / Self Care | Payer: Federal, State, Local not specified - Other | Source: Other Acute Inpatient Hospital | Attending: Psychiatry | Admitting: Psychiatry

## 2018-09-20 DIAGNOSIS — Z886 Allergy status to analgesic agent status: Secondary | ICD-10-CM | POA: Diagnosis not present

## 2018-09-20 DIAGNOSIS — Z590 Homelessness: Secondary | ICD-10-CM | POA: Diagnosis not present

## 2018-09-20 DIAGNOSIS — F1120 Opioid dependence, uncomplicated: Secondary | ICD-10-CM

## 2018-09-20 DIAGNOSIS — F419 Anxiety disorder, unspecified: Secondary | ICD-10-CM | POA: Diagnosis present

## 2018-09-20 DIAGNOSIS — Z880 Allergy status to penicillin: Secondary | ICD-10-CM | POA: Diagnosis not present

## 2018-09-20 DIAGNOSIS — Z813 Family history of other psychoactive substance abuse and dependence: Secondary | ICD-10-CM | POA: Diagnosis not present

## 2018-09-20 DIAGNOSIS — F1123 Opioid dependence with withdrawal: Secondary | ICD-10-CM | POA: Diagnosis present

## 2018-09-20 DIAGNOSIS — J45909 Unspecified asthma, uncomplicated: Secondary | ICD-10-CM | POA: Diagnosis present

## 2018-09-20 DIAGNOSIS — F332 Major depressive disorder, recurrent severe without psychotic features: Secondary | ICD-10-CM | POA: Diagnosis present

## 2018-09-20 DIAGNOSIS — G4700 Insomnia, unspecified: Secondary | ICD-10-CM | POA: Diagnosis not present

## 2018-09-20 DIAGNOSIS — Z811 Family history of alcohol abuse and dependence: Secondary | ICD-10-CM | POA: Diagnosis not present

## 2018-09-20 DIAGNOSIS — R45851 Suicidal ideations: Secondary | ICD-10-CM | POA: Diagnosis present

## 2018-09-21 ENCOUNTER — Other Ambulatory Visit: Payer: Self-pay

## 2018-09-21 ENCOUNTER — Encounter (HOSPITAL_COMMUNITY): Payer: Self-pay

## 2018-09-21 DIAGNOSIS — F332 Major depressive disorder, recurrent severe without psychotic features: Principal | ICD-10-CM

## 2018-09-21 DIAGNOSIS — G4700 Insomnia, unspecified: Secondary | ICD-10-CM

## 2018-09-21 DIAGNOSIS — F419 Anxiety disorder, unspecified: Secondary | ICD-10-CM

## 2018-09-21 LAB — LIPID PANEL
Cholesterol: 165 mg/dL (ref 0–200)
HDL: 40 mg/dL — AB (ref >40)
LDL Cholesterol: 112 mg/dL — ABNORMAL HIGH (ref 0–99)
Total CHOL/HDL Ratio: 4.1 RATIO
Triglycerides: 63 mg/dL (ref <150)
VLDL: 13 mg/dL (ref 0–40)

## 2018-09-21 LAB — TSH: TSH: 0.18 u[IU]/mL — ABNORMAL LOW (ref 0.350–4.500)

## 2018-09-21 LAB — HEMOGLOBIN A1C
Hgb A1c MFr Bld: 4.8 % (ref 4.8–5.6)
Mean Plasma Glucose: 91.06 mg/dL

## 2018-09-21 MED ORDER — ONDANSETRON 4 MG PO TBDP
4.0000 mg | ORAL_TABLET | Freq: Four times a day (QID) | ORAL | Status: DC | PRN
  Administered 2018-09-21 – 2018-09-23 (×5): 4 mg via ORAL
  Filled 2018-09-21 (×5): qty 1

## 2018-09-21 MED ORDER — HYDROXYZINE HCL 25 MG PO TABS
25.0000 mg | ORAL_TABLET | Freq: Three times a day (TID) | ORAL | Status: DC | PRN
  Administered 2018-09-21 – 2018-09-23 (×7): 25 mg via ORAL
  Filled 2018-09-21 (×4): qty 1
  Filled 2018-09-21: qty 20
  Filled 2018-09-21 (×3): qty 1

## 2018-09-21 MED ORDER — ALBUTEROL SULFATE HFA 108 (90 BASE) MCG/ACT IN AERS
INHALATION_SPRAY | RESPIRATORY_TRACT | Status: AC
  Filled 2018-09-21: qty 6.7

## 2018-09-21 MED ORDER — DICYCLOMINE HCL 20 MG PO TABS: 20.0000 mg | ORAL_TABLET | Freq: Four times a day (QID) | ORAL | Status: DC | PRN

## 2018-09-21 MED ORDER — TRAZODONE HCL 50 MG PO TABS
50.0000 mg | ORAL_TABLET | Freq: Every evening | ORAL | Status: DC | PRN
  Administered 2018-09-21: 50 mg via ORAL
  Filled 2018-09-21: qty 1

## 2018-09-21 MED ORDER — NAPROXEN 500 MG PO TABS
500.0000 mg | ORAL_TABLET | Freq: Two times a day (BID) | ORAL | Status: DC | PRN
  Administered 2018-09-21 – 2018-09-23 (×3): 500 mg via ORAL
  Filled 2018-09-21 (×3): qty 1

## 2018-09-21 MED ORDER — LORAZEPAM 1 MG PO TABS: 1.0000 mg | ORAL_TABLET | Freq: Once | ORAL | Status: DC

## 2018-09-21 MED ORDER — CLONIDINE HCL 0.1 MG PO TABS
0.1000 mg | ORAL_TABLET | ORAL | Status: DC
  Administered 2018-09-23 – 2018-09-24 (×2): 0.1 mg via ORAL
  Filled 2018-09-21 (×4): qty 1

## 2018-09-21 MED ORDER — ONDANSETRON 4 MG PO TBDP
ORAL_TABLET | ORAL | Status: AC
  Filled 2018-09-21: qty 1

## 2018-09-21 MED ORDER — LOPERAMIDE HCL 2 MG PO CAPS: 2.0000 mg | ORAL_CAPSULE | ORAL | Status: DC | PRN

## 2018-09-21 MED ORDER — CLONIDINE HCL 0.1 MG PO TABS
0.1000 mg | ORAL_TABLET | Freq: Four times a day (QID) | ORAL | Status: AC
  Administered 2018-09-21 – 2018-09-22 (×8): 0.1 mg via ORAL
  Filled 2018-09-21 (×9): qty 1

## 2018-09-21 MED ORDER — CLONIDINE HCL 0.1 MG PO TABS
0.1000 mg | ORAL_TABLET | Freq: Every day | ORAL | Status: DC
  Administered 2018-09-23: 0.1 mg via ORAL
  Filled 2018-09-21: qty 1

## 2018-09-21 MED ORDER — MAGNESIUM HYDROXIDE 400 MG/5ML PO SUSP: 30.0000 mL | Freq: Every day | ORAL | Status: DC | PRN

## 2018-09-21 MED ORDER — ALUM & MAG HYDROXIDE-SIMETH 200-200-20 MG/5ML PO SUSP: 30.0000 mL | ORAL | Status: DC | PRN

## 2018-09-21 MED ORDER — METHOCARBAMOL 500 MG PO TABS
500.0000 mg | ORAL_TABLET | Freq: Three times a day (TID) | ORAL | Status: DC | PRN
  Administered 2018-09-21 – 2018-09-23 (×6): 500 mg via ORAL
  Filled 2018-09-21 (×6): qty 1

## 2018-09-21 MED ORDER — ACETAMINOPHEN 325 MG PO TABS: 650.0000 mg | ORAL_TABLET | Freq: Four times a day (QID) | ORAL | Status: DC | PRN

## 2018-09-21 NOTE — [facility] Group Notes (Signed)
[facility] Group Notes:  (Nursing)  Date:  09/21/2018  Time: 1:15 PM Type of Therapy:  Nurse Education  Participation Level:  Did Not Attend    Shela Nevin 09/21/2018, 6:50 PM

## 2018-09-21 NOTE — H&P (Signed)
Psychiatric Admission Assessment Adult  Patient Identification: Edward Shaw MRN:  [id number] Date of Evaluation:  09/21/2018 Chief Complaint:  mdd opiod use disorder  Principal Diagnosis: Severe recurrent major depression without psychotic features (HCC) Diagnosis:  Principal Problem:   Severe recurrent major depression without psychotic features (HCC)  History of Present Illness: Per assessment note: Edward Shaw is an 43 y.o. single male at [facility] who is a heroin addict and is homeless.  He presented in the ED today with suicidal ideation.  He states that he would be better off dead.  Patient is depressed and sad.  No specific plan of how he would harm himself at admission to ED, but later told MD that he would inject "some sort of substance" to kill himself. Patient states that he has been addicted to heroin for awhile and he accepted this lifestyle as being normal for some time, but states that he decided that he needed to get off of it because of what his use was doing to his family. Patient states that he has not used anything in the past twenty-four hours and states that he has become increasingly depressed.  Patient states that he has been crying, sad and experiencing suicidal thoughts.  Patient states that this is the fist time that he has ever been suicidal.  Patient states that the more withdrawal symptoms that he experiences the more depressed that he gets and thinks "I don't want to be here." Patient states that he has never had any psychiatric care on an inpatient or an outpatient basis in the past, but states that he completed [facility] three months ago and states that he had some clean time, but relapsed.  Patient states that he is not experiencing any appetite issues, but states that he has a difficult time sleeping due to the withdrawal and his depression.  Patient states that he has this time he feels loke he is in a different state of mind that  is more mental this time than related to his substance use.  Patient states, "I don't want to be alone, I know that sounds crazy."  Patient is scared of what he might do. Patient denies HI/Psychosis.  Patient states that he started using heroin at the age of 43.  he states that he is currently using 1/2 gram to 1 gram daily.  He denies any use of other drugs or alcohol.  Patient denies any history of abuse or self-mutilation.  Patient states that both of his parents are alcoholics, his grandparents were alcoholics and he states that he had an uncle to OD on heroin.  Patient states that his grandfather hung himself when patient was 14five years old. Patient states that he is single.  He states that he has a nie year old daughter.  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  Patient states that he has very little support. Patient states that he has no current legal issues and he is not on probation. Patient presented as alert and oriented, his thoughts organized and his memory intact.  His mood depressed and his affect flat.  He was cooperative with the assessment process and he appeared to be clean and neat.  His psycho-motr activity was normal, his eye contact was good and his speech was clear and coherent.  Patient did not appear to be responding to internal stimuli.  Evaluation: Edward Shaw is awake alert and oriented x3.  Presents with a bright and  pleasant affect.  Patient reports he is not homicidal or suicidal as he states somebody advised him to report those symptoms in order to get housing.  Patient denies depression or depressive symptoms.  Was noted patient recently discharged from [place], patient validates.  As he reports he is hopeful to be readmitted.  Reports needing help with heroin abuse use.  Denies detox related symptoms.  Denies family history of mental illness.  Denies previous suicidal attempts plan or ideations.  Support encouragement reassurance was provided    Associated  Signs/Symptoms: Depression Symptoms:  Denied (Hypo) Manic Symptoms:  Irritable Mood, intermittently Anxiety Symptoms:  Excessive Worry, Psychotic Symptoms:  Hallucinations: None PTSD Symptoms: NA Total Time spent with patient: 15 minutes  Past Psychiatric History:   Is the patient at risk to self? Yes.    Has the patient been a risk to self in the past 6 months? No.  Has the patient been a risk to self within the distant past? No.  Is the patient a risk to others? No.  Has the patient been a risk to others in the past 6 months? No.  Has the patient been a risk to others within the distant past? No.   Prior Inpatient Therapy: Prior Inpatient Therapy: Yes Prior Therapy Dates: 05/2018 Prior Therapy Facilty/Provider(s): [facility] Reason for Treatment: SA Prior Outpatient Therapy: Prior Outpatient Therapy: No Does patient have an ACCT team?: No Does patient have Intensive In-House Services?  : No Does patient have [facility] services? : No Does patient have P4CC services?: No  Alcohol Screening: 1. How often do you have a drink containing alcohol?: Never 2. How many drinks containing alcohol do you have on a typical day when you are drinking?: 1 or 2 3. How often do you have six or more drinks on one occasion?: Never AUDIT-C Score: 0 4. How often during the last year have you found that you were not able to stop drinking once you had started?: Never 5. How often during the last year have you failed to do what was normally expected from you becasue of drinking?: Never 6. How often during the last year have you needed a first drink in the morning to get yourself going after a heavy drinking session?: Never 7. How often during the last year have you had a feeling of guilt of remorse after drinking?: Never 8. How often during the last year have you been unable to remember what happened the night before because you had been drinking?: Never 9. Have you or someone else been injured as a result of  your drinking?: No 10. Has a relative or friend or a doctor or another health worker been concerned about your drinking or suggested you cut down?: No Alcohol Use Disorder Identification Test Final Score (AUDIT): 0 Intervention/Follow-up: Alcohol Education Substance Abuse History in the last 12 months:  Yes.   Consequences of Substance Abuse: NA Previous Psychotropic Medications: No  Psychological Evaluations: No  Past Medical History:  Past Medical History:  Diagnosis Date  . Asthma    History reviewed. No pertinent surgical history. Family History:  Family History  Problem Relation Age of Onset  . Asthma Neg Hx    Family Psychiatric  History: Denied  Tobacco Screening:   Social History:  Social History   Substance and Sexual Activity  Alcohol Use No  . Frequency: Never     Social History   Substance and Sexual Activity  Drug Use Yes  . Types: Heroin, Marijuana  Additional Social History: Marital status: Single    Pain Medications: Pt has a history of abusing opioids Prescriptions: Unknown Over the Counter: Unknown History of alcohol / drug use?: Yes Longest period of sobriety (when/how long): 3 months Name of Substance 1: Heroin 1 - Age of First Use: 37 1 - Amount (size/oz): 0.5-1 gram 1 - Frequency: Daily 1 - Duration: Ongoing 1 - Last Use / Amount: 09/19/18                  Allergies:   Allergies  Allergen Reactions  . Penicillins     CHILDHOOD ALLERGY Has patient had a PCN reaction causing immediate rash, facial/tongue/throat swelling, SOB or lightheadedness with hypotension: {/Unknown Has patient had a PCN reaction causing severe rash involving mucus membranes or skin necrosis: {Unknown: Has patient had a PCN reaction that required hospitalization: Unknown Has patient had a PCN reaction occurring within the last 10 years: Unknown If all of the above answers are "NO", then may proceed with Cephalosporin use.   . Tylenol [Acetaminophen]     Lab Results:  Results for orders placed or performed during the hospital encounter of 09/20/18 (from the past 48 hour(s))  Lipid panel     Status: Abnormal   Collection Time: 09/21/18  7:47 AM  Result Value Ref Range   Cholesterol 165 0 - 200 mg/dL   Triglycerides 63 <681 mg/dL   HDL 40 (L) >15 mg/dL   Total CHOL/HDL Ratio 4.1 RATIO   VLDL 13 0 - 40 mg/dL   LDL Cholesterol 726 (H) 0 - 99 mg/dL    Comment:        Total Cholesterol/HDL:CHD Risk Coronary Heart Disease Risk Table                     Men   Women  1/2 Average Risk   3.4   3.3  Average Risk       5.0   4.4  2 X Average Risk   9.6   7.1  3 X Average Risk  23.4   11.0        Use the calculated Patient Ratio above and the CHD Risk Table to determine the patient's CHD Risk.        ATP III CLASSIFICATION (LDL):  <100     mg/dL   Optimal  203-559  mg/dL   Near or Above                    Optimal  130-159  mg/dL   Borderline  741-638  mg/dL   High  >453     mg/dL   Very High Performed at [facility], [address], [place], [place] [address]   TSH     Status: Abnormal   Collection Time: 09/21/18  7:47 AM  Result Value Ref Range   TSH 0.180 (L) 0.350 - 4.500 uIU/mL    Comment: Performed by a 3rd Generation assay with a functional sensitivity of <=0.01 uIU/mL. Performed at [facility], [address] [place], [place] [address]     Blood Alcohol level:  No results found for: [facility]  Metabolic Disorder Labs:  No results found for: HGBA1C, MPG No results found for: PROLACTIN Lab Results  Component Value Date   CHOL 165 09/21/2018   TRIG 63 09/21/2018   HDL 40 (L) 09/21/2018   CHOLHDL 4.1 09/21/2018   VLDL 13 09/21/2018   LDLCALC 112 (H) 09/21/2018  Current Medications: Current Facility-Administered Medications  Medication Dose Route Frequency Provider Last Rate Last Dose  . albuterol (PROVENTIL HFA;VENTOLIN HFA) 108 (90 Base) MCG/ACT inhaler           . alum &  mag hydroxide-simeth (MAALOX/MYLANTA) 200-200-20 MG/5ML suspension 30 mL  30 mL Oral Q4H PRN Nira ConnBerry, Jason A, NP      . cloNIDine (CATAPRES) tablet 0.1 mg  0.1 mg Oral QID Nira ConnBerry, Jason A, NP   0.1 mg at 09/21/18 0853   Followed by  . [START ON 09/23/2018] cloNIDine (CATAPRES) tablet 0.1 mg  0.1 mg Oral BH-qamhs Jackelyn PolingBerry, Jason A, NP       Followed by  . [START ON 09/25/2018] cloNIDine (CATAPRES) tablet 0.1 mg  0.1 mg Oral QAC breakfast Nira ConnBerry, Jason A, NP      . dicyclomine (BENTYL) tablet 20 mg  20 mg Oral Q6H PRN Nira ConnBerry, Jason A, NP      . hydrOXYzine (ATARAX/VISTARIL) tablet 25 mg  25 mg Oral TID PRN Nira ConnBerry, Jason A, NP   25 mg at 09/21/18 0857  . loperamide (IMODIUM) capsule 2-4 mg  2-4 mg Oral PRN Nira ConnBerry, Jason A, NP      . LORazepam (ATIVAN) tablet 1 mg  1 mg Oral Once Nira ConnBerry, Jason A, NP      . magnesium hydroxide (MILK OF MAGNESIA) suspension 30 mL  30 mL Oral Daily PRN Nira ConnBerry, Jason A, NP      . methocarbamol (ROBAXIN) tablet 500 mg  500 mg Oral Q8H PRN Nira ConnBerry, Jason A, NP   500 mg at 09/21/18 0857  . naproxen (NAPROSYN) tablet 500 mg  500 mg Oral BID PRN Nira ConnBerry, Jason A, NP      . ondansetron (ZOFRAN-ODT) disintegrating tablet 4 mg  4 mg Oral Q6H PRN Nira ConnBerry, Jason A, NP   4 mg at 09/21/18 0857  . traZODone (DESYREL) tablet 50 mg  50 mg Oral QHS PRN Jackelyn PolingBerry, Jason A, NP       PTA Medications: Medications Prior to Admission  Medication Sig Dispense Refill Last Dose  . albuterol (PROVENTIL HFA;VENTOLIN HFA) 108 (90 Base) MCG/ACT inhaler Inhale 1-2 puffs into the lungs every 4 (four) hours as needed for wheezing or shortness of breath (use nebs first inpatient). 1 Inhaler 0   . albuterol (PROVENTIL) (2.5 MG/3ML) 0.083% nebulizer solution Take 3 mLs (2.5 mg total) by nebulization every 4 (four) hours as needed for wheezing or shortness of breath. (Patient not taking: Reported on 12/18/2017) 75 mL 12 Not Taking at Unknown time  . guaiFENesin-dextromethorphan (ROBITUSSIN DM) 100-10 MG/5ML syrup Take 5 mLs by  mouth every 4 (four) hours as needed for cough. 118 mL 0     Musculoskeletal: Strength & Muscle Tone: within normal limits Gait & Station: normal Patient leans: N/A  Psychiatric Specialty Exam: Physical Exam  Vitals reviewed. Constitutional: He is oriented to person, place, and time. He appears well-developed.  Neurological: He is alert and oriented to person, place, and time.  Psychiatric: He has a normal mood and affect.    Review of Systems  Psychiatric/Behavioral: Positive for substance abuse. Negative for depression. The patient is nervous/anxious and has insomnia.   All other systems reviewed and are negative.   Blood pressure 108/77, pulse 84, temperature 98.2 F (36.8 C), resp. rate 18, height 5\' 11"  (1.803 m), weight 73.9 kg, SpO2 100 %.Body mass index is 22.73 kg/m.  General Appearance: Casual  Eye Contact:  Fair  Speech:  Clear and Coherent  Volume:  Normal  Mood:  Anxious and Depressed  Affect:  Congruent  Thought Process:  Coherent  Orientation:  Full (Time, Place, and Person)  Thought Content:  WDL  Suicidal Thoughts:  No  Homicidal Thoughts:  No  Memory:  Immediate;   Fair Recent;   Fair Remote;   Fair  Judgement:  Fair  Insight:  Fair  Psychomotor Activity:  Normal  Concentration:  Concentration: Fair  Recall:  [employer] of Knowledge:  Fair  Language:  Fair  Akathisia:  No  Handed:  Right  AIMS (if indicated):     Assets:  Communication Skills Desire for Improvement Resilience Social Support  ADL's:  Intact  Cognition:  WNL  Sleep:  Number of Hours: 5    Treatment Plan Summary: Daily contact with patient to assess and evaluate symptoms and progress in treatment and Medication management  Observation Level/Precautions:  15 minute checks  Laboratory:  CBC Chemistry Profile HbAIC HCG UDS  Psychotherapy:  Individual and group session  Medications:  See SRA   Consultations:  Psychiatry and CSW   Discharge Concerns:  Safety, stabilization,  and risk of access to medication and medication stabilization   Estimated LOS: 5-7 days   Other:     Physician Treatment Plan for Primary Diagnosis: Severe recurrent major depression without psychotic features (HCC) Long Term Goal(s): Improvement in symptoms so as ready for discharge  Short Term Goals: Ability to identify changes in lifestyle to reduce recurrence of condition will improve and Ability to verbalize feelings will improve  Physician Treatment Plan for Secondary Diagnosis: Principal Problem:   Severe recurrent major depression without psychotic features (HCC)  Long Term Goal(s): Improvement in symptoms so as ready for discharge  Short Term Goals: Ability to identify changes in lifestyle to reduce recurrence of condition will improve, Ability to verbalize feelings will improve, Ability to identify and develop effective coping behaviors will improve, Ability to maintain clinical measurements within normal limits will improve and Ability to identify triggers associated with substance abuse/mental health issues will improve  I certify that inpatient services furnished can reasonably be expected to improve the patient's condition.    Oneta Rack, NP 1/4/202010:45 AM

## 2018-09-21 NOTE — BHH Group Notes (Signed)
LCSW Group Therapy Note  09/21/2018    10:00-11:00am   Type of Therapy and Topic:  Group Therapy: Early Messages Received About Anger  Participation Level:  Did Not Attend   Description of Group:   In this group, patients shared and discussed the early messages received in their lives about anger through parental or other adult modeling, teaching, repression, punishment, violence, and more.  Participants identified how those childhood lessons influence even now how they usually or often react when angered.  The group discussed that anger is a secondary emotion and what may be the underlying emotional themes that come out through anger outbursts or that are ignored through anger suppression.  Finally, as a group there was a conversation about the workbook's quote that "There is nothing wrong with anger; it is just a sign something needs to change."     Therapeutic Goals: 1. Patients will identify one or more childhood message about anger that they received and how it was taught to them. 2. Patients will discuss how these childhood experiences have influenced and continue to influence their own expression or repression of anger even today. 3. Patients will explore possible primary emotions that tend to fuel their secondary emotion of anger. 4. Patients will learn that anger itself is normal and cannot be eliminated, and that healthier coping skills can assist with resolving conflict rather than worsening situations.  Summary of Patient Progress:  N/A  Therapeutic Modalities:   Cognitive Behavioral Therapy Motivation Interviewing  Edward Shaw  .  

## 2018-09-21 NOTE — Progress Notes (Signed)
D. Pt has been calm and cooperative, friendly upon approach- visible in the milieu interacting appropriately with peers and staff. Per pt's self inventory, pt rates his depression, hopelessness and anxiety a 5/5/5, respectively. Pt writes that his most important goal today is "sleep".   Pt currently denies SI/HI and AVH and agrees to contact staff before acting on any harmful thoughts.  A. Labs and vitals monitored. Pt compliant with medications. Pt supported emotionally and encouraged to express concerns and ask questions.   R. Pt remains safe with 15 minute checks. Will continue POC.

## 2018-09-21 NOTE — Tx Team (Signed)
Initial Treatment Plan 09/21/2018 1:46 AM [name] MVV:[id number]    PATIENT STRESSORS: Marital or family conflict Substance abuse   PATIENT STRENGTHS: Ability for insight Active sense of humor Average or above average intelligence   PATIENT IDENTIFIED PROBLEMS: "substance abuse"  "anxiety"                   DISCHARGE CRITERIA:  Ability to meet basic life and health needs Adequate post-discharge living arrangements Improved stabilization in mood, thinking, and/or behavior  PRELIMINARY DISCHARGE PLAN: Attend aftercare/continuing care group Attend PHP/IOP Attend 12-step recovery group  PATIENT/FAMILY INVOLVEMENT: This treatment plan has been presented to and reviewed with the patient, Edward Shaw.  The patient and family have been given the opportunity to ask questions and make suggestions.  Jonetta Speak, RN 09/21/2018, 1:46 AM

## 2018-09-21 NOTE — Progress Notes (Signed)
Patient presents with anxious affect and behavior during admission interview and assessment. VS monitored and recorded. Skin check performed with Delicia MHT and revealed multiple superficial, healed lacerations to upper back and multiple tattoos. Contraband was not found. Patient was oriented to unit and schedule. Pt states "I am here to get to a rehab facility". Pt denies SI/HI/AVH at this time. PO fluids provided. Safety maintained. Rest encouraged.
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
## 2018-09-21 NOTE — BH Assessment (Signed)
Assessment Note  PER DANNY SPRINKLE, LCAS:  Edward Shaw is an 43 y.o. single male at [facility] who is a heroin addict and is homeless.  He presented in the ED today with suicidal ideation.  He states that he would be better off dead.  Patient is depressed and sad.  No specific plan of how he would harm himself at admission to ED, but later told MD that he would inject "some sort of substance" to kill himself. Patient states that he has been addicted to heroin for awhile and he accepted this lifestyle as being normal for some time, but states that he decided that he needed to get off of it because of what his use was doing to his family. Patient states that he has not used anything in the past twenty-four hours and states that he has become increasingly depressed.  Patient states that he has been crying, sad and experiencing suicidal thoughts.  Patient states that this is the fist time that he has ever been suicidal.  Patient states that the more withdrawal symptoms that he experiences the more depressed that he gets and thinks "I don't want to be here." Patient states that he has never had any psychiatric care on an inpatient or an outpatient basis in the past, but states that he completed [facility] Residential Treatment at [facility] three months ago and states that he had some clean time, but relapsed.  Patient states that he is not experiencing any appetite issues, but states that he has a difficult time sleeping due to the withdrawal and his depression.  Patient states that he has this time he feels loke he is in a different state of mind that is more mental this time than related to his substance use.  Patient states, "I don't want to be alone, I know that sounds crazy."  Patient is scared of what he might do. Patient denies HI/Psychosis.  Patient states that he started using heroin at the age of 81.  he states that he is currently using 1/2 gram to 1 gram daily.  He denies any use of other drugs or  alcohol.  Patient denies any history of abuse or self-mutilation.  Patient states that both of his parents are alcoholics, his grandparents were alcoholics and he states that he had an uncle to OD on heroin.  Patient states that his grandfather hung himself when patient was 43 years old. Patient states that he is single.  He states that he has a nie year old daughter.  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  Patient states that he has very little support. Patient states that he has no current legal issues and he is not on probation. Patient presented as alert and oriented, his thoughts organized and his memory intact.  His mood depressed and his affect flat.  He was cooperative with the assessment process and he appeared to be clean and neat.  His psycho-motr activity was normal, his eye contact was good and his speech was clear and coherent.  Patient did not appear to be responding to internal stimuli. .   Diagnosis: F32.2 Major depressive disorder, Single episode, Severe F11.20 Opioid use disorder, Severe  Past Medical History:  Past Medical History:  Diagnosis Date  . Asthma     No past surgical history on file.  Family History:  Family History  Problem Relation Age of Onset  . Asthma Neg Hx     Social History:  reports  that he has never smoked. He has never used smokeless tobacco. He reports current drug use. He reports that he does not drink alcohol.  Additional Social History:  Alcohol / Drug Use Pain Medications: Pt has a history of abusing opioids Prescriptions: Unknown Over the Counter: Unknown History of alcohol / drug use?: Yes Longest period of sobriety (when/how long): 3 months Substance #1 Name of Substance 1: Heroin 1 - Age of First Use: 37 1 - Amount (size/oz): 0.5-1 gram 1 - Frequency: Daily 1 - Duration: Ongoing 1 - Last Use / Amount: 09/19/18  CIWA:   COWS:    Allergies:  Allergies  Allergen Reactions  . Penicillins     CHILDHOOD  ALLERGY Has patient had a PCN reaction causing immediate rash, facial/tongue/throat swelling, SOB or lightheadedness with hypotension: {/Unknown Has patient had a PCN reaction causing severe rash involving mucus membranes or skin necrosis: {Unknown: Has patient had a PCN reaction that required hospitalization: Unknown Has patient had a PCN reaction occurring within the last 10 years: Unknown If all of the above answers are "NO", then may proceed with Cephalosporin use.     Home Medications:  Medications Prior to Admission  Medication Sig Dispense Refill  . albuterol (PROVENTIL HFA;VENTOLIN HFA) 108 (90 Base) MCG/ACT inhaler Inhale 1-2 puffs into the lungs every 4 (four) hours as needed for wheezing or shortness of breath (use nebs first inpatient). 1 Inhaler 0  . albuterol (PROVENTIL) (2.5 MG/3ML) 0.083% nebulizer solution Take 3 mLs (2.5 mg total) by nebulization every 4 (four) hours as needed for wheezing or shortness of breath. (Patient not taking: Reported on 12/18/2017) 75 mL 12  . guaiFENesin-dextromethorphan (ROBITUSSIN DM) 100-10 MG/5ML syrup Take 5 mLs by mouth every 4 (four) hours as needed for cough. 118 mL 0    OB/GYN Status:  No LMP for male patient.  General Assessment Data Location of Assessment: [facility] TTS Assessment: Out of system Is this a Tele or Face-to-Face Assessment?: Tele Assessment Is this an Initial Assessment or a Re-assessment for this encounter?: Initial Assessment Patient Accompanied by:: N/A Language Other than English: No Living Arrangements: Homeless/Shelter What gender do you identify as?: Male Marital status: Single Maiden name: NA Pregnancy Status: No Living Arrangements: Other (Comment)(Homeless) Can pt return to current living arrangement?: Yes Admission Status: Voluntary Is patient capable of signing voluntary admission?: Yes Referral Source: [facility] Insurance type: Self-pay     Crisis Care Plan Living  Arrangements: Other (Comment)(Homeless) Legal Guardian: Other:(Self) Name of Psychiatrist: None Name of Therapist: None  Education Status Is patient currently in school?: No Is the patient employed, unemployed or receiving disability?: Employed  Risk to self with the past 6 months Suicidal Ideation: Yes-Currently Present Has patient been a risk to self within the past 6 months prior to admission? : Yes Suicidal Intent: Yes-Currently Present Has patient had any suicidal intent within the past 6 months prior to admission? : Yes Is patient at risk for suicide?: Yes Suicidal Plan?: Yes-Currently Present Has patient had any suicidal plan within the past 6 months prior to admission? : Yes Specify Current Suicidal Plan: Inject a substance Access to Means: Yes Specify Access to Suicidal Means: Access to substances What has been your use of drugs/alcohol within the last 12 months?: Pt using heroin daily Previous Attempts/Gestures: No How many times?: 0 Other Self Harm Risks: None Triggers for Past Attempts: None known Intentional Self Injurious Behavior: None Family Suicide History: Yes(Grandfather hanged himself) Recent stressful life event(s): Other (Comment)(Homeless)  Persecutory voices/beliefs?: No Depression: Yes Depression Symptoms: Despondent, Insomnia, Tearfulness, Isolating, Fatigue, Guilt, Loss of interest in usual pleasures, Feeling worthless/self pity Substance abuse history and/or treatment for substance abuse?: Yes Suicide prevention information given to non-admitted patients: Not applicable  Risk to Others within the past 6 months Homicidal Ideation: No Does patient have any lifetime risk of violence toward others beyond the six months prior to admission? : No Thoughts of Harm to Others: No Current Homicidal Intent: No Current Homicidal Plan: No Access to Homicidal Means: No Identified Victim: None History of harm to others?: No Assessment of Violence: None  Noted Violent Behavior Description: None Does patient have access to weapons?: No Criminal Charges Pending?: No Does patient have a court date: No Is patient on probation?: No  Psychosis Hallucinations: None noted Delusions: None noted  Mental Status Report Appearance/Hygiene: Unremarkable Eye Contact: Good Motor Activity: Unremarkable Speech: Logical/coherent Level of Consciousness: Alert Mood: Sad Affect: Depressed Anxiety Level: Minimal Thought Processes: Coherent, Relevant Judgement: Impaired Orientation: Person, Place, Time, Situation, Appropriate for developmental age Obsessive Compulsive Thoughts/Behaviors: None  Cognitive Functioning Concentration: Fair Memory: Recent Intact, Remote Intact Is patient IDD: No Insight: Poor Impulse Control: Poor Appetite: Poor Have you had any weight changes? : No Change Sleep: Decreased Total Hours of Sleep: 6 Vegetative Symptoms: None  ADLScreening [facility] Assessment Services) Patient's cognitive ability adequate to safely complete daily activities?: Yes Patient able to express need for assistance with ADLs?: Yes Independently performs ADLs?: Yes (appropriate for developmental age)  Prior Inpatient Therapy Prior Inpatient Therapy: Yes Prior Therapy Dates: 05/2018 Prior Therapy Facilty/Provider(s): [facility] Reason for Treatment: SA  Prior Outpatient Therapy Prior Outpatient Therapy: No Does patient have an ACCT team?: No Does patient have Intensive In-House Services?  : No Does patient have [facility] services? : No Does patient have P4CC services?: No  ADL Screening (condition at time of admission) Patient's cognitive ability adequate to safely complete daily activities?: Yes Is the patient deaf or have difficulty hearing?: No Does the patient have difficulty seeing, even when wearing glasses/contacts?: No Does the patient have difficulty concentrating, remembering, or making decisions?: No Patient able to express need for  assistance with ADLs?: Yes Does the patient have difficulty dressing or bathing?: No Independently performs ADLs?: Yes (appropriate for developmental age) Does the patient have difficulty walking or climbing stairs?: No Weakness of Legs: None Weakness of Arms/Hands: None  Home Assistive Devices/Equipment Home Assistive Devices/Equipment: None    Abuse/Neglect Assessment (Assessment to be complete while patient is alone) Abuse/Neglect Assessment Can Be Completed: Yes Physical Abuse: Denies Verbal Abuse: Denies Sexual Abuse: Denies Exploitation of patient/patient's resources: Denies Self-Neglect: Denies     Merchant navy officer (For Healthcare) Does Patient Have a Medical Advance Directive?: No Would patient like information on creating a medical advance directive?: No - Patient declined          Disposition: Pt accepted to [facility] by Reola Calkins, NP.  Disposition Initial Assessment Completed for this Encounter: Yes Disposition of Patient: Admit Type of inpatient treatment program: Adult Patient refused recommended treatment: No  On Site Evaluation by:   Reviewed with Physician:    Patsy Baltimore, Harlin Rain 09/21/2018 12:07 AM

## 2018-09-21 NOTE — Progress Notes (Signed)
Patient has been up in the dayroom watching the football game until AA meeting which he attended.Patient currently denies having pain, -si/hi/a/v hall.Writer spoke with him 1:1 and he talked about how well he had been doing and relapsed. He is hoping to attend a long term treatment facility once discharged.  Support and encouragement offered, safety maintained on unit, will continue to monitor.

## 2018-09-21 NOTE — [facility] Suicide Risk Assessment (Signed)
[facility] Admission Suicide Risk Assessment   Nursing information obtained from:  Patient Demographic factors:  Male, Caucasian, Low socioeconomic status, Living alone, Unemployed Current Mental Status:  NA Loss Factors:  NA Historical Factors:  NA Risk Reduction Factors:  Responsible for children under 42 years of age  Total Time spent with patient: 30 minutes Principal Problem: <principal problem not specified> Diagnosis:  Active Problems:   Severe recurrent major depression without psychotic features (HCC)  Subjective Data: Patient is seen and examined.  Patient is a 43 year old male with a past psychiatric history significant for opiate dependence who presented to the [facility] emergency department on 09/20/2018 with suicidal ideation.  The patient stated that he is used heroin "pretty much all my life", but had decided now that he wanted to get sober.  The patient stated that he had been using heroin for many years.  He stated that he had no days of sobriety in the last 6 months.  He stated he had been hospitalized previously on 3 occasions for detox, and one residential treatment at [facility].  The patient stated to me that he had not been in a substance rehabilitation program for years, but the assessment note from the counselor and TTS stated that he had been at residential treatment 3 months ago.  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  He also stated that he is essentially homeless at this time.  He denied any court dates or current charges.  His CBC, Chem-7 were essentially normal.  His urinalysis was significant for 1+ protein, negative leukocyte esterase, but 5-10 hyaline casts, and 2-5 white blood cells.  There is large amount of mucus.  His urine drug screen was positive for opiates as well as marijuana.  He has a reported history of asthma, but no other medical problems.  He was admitted to the  hospital for evaluation and stabilization.  Continued Clinical Symptoms:  Alcohol Use Disorder Identification Test Final Score (AUDIT): 0 The "Alcohol Use Disorders Identification Test", Guidelines for Use in Primary Care, Second Edition.  World Science writer [facility]). Score between 0-7:  no or low risk or alcohol related problems. Score between 8-15:  moderate risk of alcohol related problems. Score between 16-19:  high risk of alcohol related problems. Score 20 or above:  warrants further diagnostic evaluation for alcohol dependence and treatment.   CLINICAL FACTORS:   Alcohol/Substance Abuse/Dependencies   Musculoskeletal: Strength & Muscle Tone: within normal limits Gait & Station: normal Patient leans: N/A  Psychiatric Specialty Exam: Physical Exam  Nursing note and vitals reviewed. Constitutional: He is oriented to person, place, and time. He appears well-developed and well-nourished.  HENT:  Head: Normocephalic and atraumatic.  Respiratory: Effort normal.  Neurological: He is alert and oriented to person, place, and time.    ROS  Blood pressure 114/77, pulse 93, temperature 98.6 F (37 C), temperature source Oral, resp. rate 18, height 5\' 11"  (1.803 m), weight 73.9 kg, SpO2 99 %.Body mass index is 22.73 kg/m.  General Appearance: Disheveled  Eye Contact:  Fair  Speech:  Normal Rate  Volume:  Decreased  Mood:  Dysphoric  Affect:  Congruent  Thought Process:  Coherent and Descriptions of Associations: Intact  Orientation:  Full (Time, Place, and Person)  Thought Content:  Logical  Suicidal Thoughts:  Yes.  without intent/plan  Homicidal Thoughts:  No  Memory:  Immediate;  Fair Recent;   Fair Remote;   Fair  Judgement:  Intact  Insight:  Lacking  Psychomotor Activity:  Normal  Concentration:  Concentration: Fair and Attention Span: Fair  Recall:  [employer] of Knowledge:  Fair  Language:  Fair  Akathisia:  Negative  Handed:  Right  AIMS (if indicated):      Assets:  Desire for Improvement Physical Health  ADL's:  Intact  Cognition:  WNL  Sleep:  Number of Hours: 5      COGNITIVE FEATURES THAT CONTRIBUTE TO RISK:  None    SUICIDE RISK:   Minimal: No identifiable suicidal ideation.  Patients presenting with no risk factors but with morbid ruminations; may be classified as minimal risk based on the severity of the depressive symptoms  PLAN OF CARE: Patient is seen and examined.  Patient is a 43 year old male with the above-stated past psychiatric history who is admitted secondary to requesting detox, suicidal ideation.  He will be admitted to the hospital.  He will be integrated into the milieu.  He will be placed on the opiate detox protocol.  He will meet with social work and discuss options in terms of residential treatment after hospitalization.  I think his mood is more related to a substance-induced mood disorder versus major depression.  I am going to hold antidepressant medicine at this time until he is preserved, not in active withdrawal.  With regard to medical issues we will repeat his urinalysis just to make sure given the abnormalities in [place].  I certify that inpatient services furnished can reasonably be expected to improve the patient's condition.   Antonieta Pert, MD 09/21/2018, 7:50 AM

## 2018-09-22 DIAGNOSIS — R45851 Suicidal ideations: Secondary | ICD-10-CM

## 2018-09-22 DIAGNOSIS — F1120 Opioid dependence, uncomplicated: Secondary | ICD-10-CM

## 2018-09-22 MED ORDER — TRAZODONE HCL 100 MG PO TABS
ORAL_TABLET | ORAL | Status: AC
  Administered 2018-09-22: 100 mg
  Filled 2018-09-22: qty 1

## 2018-09-22 MED ORDER — ALBUTEROL SULFATE HFA 108 (90 BASE) MCG/ACT IN AERS
2.0000 | INHALATION_SPRAY | RESPIRATORY_TRACT | Status: DC | PRN
  Administered 2018-09-22 – 2018-09-24 (×6): 2 via RESPIRATORY_TRACT
  Filled 2018-09-22: qty 6.7

## 2018-09-22 MED ORDER — TRAZODONE HCL 100 MG PO TABS
100.0000 mg | ORAL_TABLET | Freq: Every evening | ORAL | Status: DC | PRN
  Administered 2018-09-22 – 2018-09-23 (×3): 100 mg via ORAL
  Filled 2018-09-22 (×7): qty 1

## 2018-09-22 MED ORDER — ENSURE ENLIVE PO LIQD
237.0000 mL | Freq: Two times a day (BID) | ORAL | Status: DC
  Administered 2018-09-22: 237 mL via ORAL

## 2018-09-22 NOTE — Progress Notes (Signed)
NUTRITION ASSESSMENT  Pt identified as at risk on the Malnutrition Screen Tool  INTERVENTION: 1. Supplements: Ensure Enlive po BID, each supplement provides 350 kcal and 20 grams of protein  NUTRITION DIAGNOSIS: Unintentional weight loss related to sub-optimal intake as evidenced by pt report.   Goal: Pt to meet >/= 90% of their estimated nutrition needs.  Monitor:  PO intake  Assessment:  Pt admitted with depression. Pt currently homeless and has been using heroin. Per weight history, pt has lost 16 lb over the past year, this is insignificant for time frame. Will still order supplements in response to weights trending down.  Height: Ht Readings from Last 1 Encounters:  09/21/18 5\' 11"  (1.803 m)    Weight: Wt Readings from Last 1 Encounters:  09/21/18 73.9 kg    Weight Hx: Wt Readings from Last 10 Encounters:  09/21/18 73.9 kg  10/15/17 81.4 kg    BMI:  Body mass index is 22.73 kg/m. Pt meets criteria for normal based on current BMI.  Estimated Nutritional Needs: Kcal: 25-30 kcal/kg Protein: > 1 gram protein/kg Fluid: 1 ml/kcal  Diet Order:  Diet Order            Diet regular Room service appropriate? Yes; Fluid consistency: Thin  Diet effective now             Pt is also offered choice of unit snacks mid-morning and mid-afternoon.  Pt is eating as desired.   Lab results and medications reviewed.   Tilda Franco, MS, RD, LDN [facility] Inpatient Clinical Dietitian Pager: [phone] After Hours Pager: [phone]

## 2018-09-22 NOTE — Progress Notes (Addendum)
[facility] MD Progress Note  09/22/2018 10:57 AM Edward Shaw  MRN:  [id number]  Subjective: Edward Shaw reports, "I'm doing okay, just having terrible withdrawal symptoms. The detoxification regimen are helping, it is just they work very slowly. Subutex works faster for opioid withdrawal symptoms for my, too bad you guys do not order it here. I'm not depressed at all. I'm not having any suicidal/homicidal thoughts. I did not sleep well last night".  (Per Md's admission SRA): Patient is a 43 year old male with a past psychiatric history significant for opiate dependence who presented to the [facility] emergency department on 09/20/2018 with suicidal ideation.  The patient stated that he is used heroin "pretty much all my life", but had decided now that he wanted to get sober.  The patient stated that he had been using heroin for many years.  He stated that he had no days of sobriety in the last 6 months.  He stated he had been hospitalized previously on 3 occasions for detox, and one residential treatment at [facility].  The patient stated to me that he had not been in a substance rehabilitation program for years, but the assessment note from the counselor and TTS stated that he had been at residential treatment 3 months ago.  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  He also stated that he is essentially homeless at this time.  He denied any court dates or current charges.  His CBC, Chem-7 were essentially normal.  His urinalysis was significant for 1+ protein, negative leukocyte esterase, but 5-10 hyaline casts, and 2-5 white blood cells.  There is large amount of mucus.  His urine drug screen was positive for opiates as well as marijuana.  He has a reported history of asthma, but no other medical problems.  He was admitted to the hospital for evaluation and stabilization.  Objective: Edward Shaw is seen, chart reviewed. The chart  findings discussed with the treatment team. He presents alert, oriented & aware of situation. He is visible on the unit, attending group sessions. He is complaining of terrible opioid withdrawal symptoms. However, he denies any symptoms of depression, SIHI, AVH, delusional thoughts or paranoia. He does not appear to be responding to any internal stimuli. Edward Shaw has agreed to continue his current plan of care as already in progress.  Principal Problem: Opioid type dependence, continuous (HCC)  Diagnosis: Principal Problem:   Opioid type dependence, continuous (HCC) Active Problems:   Severe recurrent major depression without psychotic features (HCC)  Total Time spent with patient: 25 minutes  Past Psychiatric History: See H&P  Past Medical History:  Past Medical History:  Diagnosis Date  . Asthma    History reviewed. No pertinent surgical history.  Family History:  Family History  Problem Relation Age of Onset  . Asthma Neg Hx    Family Psychiatric  History: See H&P  Social History:  Social History   Substance and Sexual Activity  Alcohol Use No  . Frequency: Never     Social History   Substance and Sexual Activity  Drug Use Yes  . Types: Heroin, Marijuana    Social History   Socioeconomic History  . Marital status: Single    Spouse name: Not on file  . Number of children: Not on file  . Years of education: Not on file  . Highest education level: Not on file  Occupational History  .  Not on file  Social Needs  . Financial resource strain: Not on file  . Food insecurity:    Worry: Not on file    Inability: Not on file  . Transportation needs:    Medical: Not on file    Non-medical: Not on file  Tobacco Use  . Smoking status: Never Smoker  . Smokeless tobacco: Current User    Types: Chew  Substance and Sexual Activity  . Alcohol use: No    Frequency: Never  . Drug use: Yes    Types: Heroin, Marijuana  . Sexual activity: Not Currently  Lifestyle  .  Physical activity:    Days per week: Not on file    Minutes per session: Not on file  . Stress: Not on file  Relationships  . Social connections:    Talks on phone: Not on file    Gets together: Not on file    Attends religious service: Not on file    Active member of club or organization: Not on file    Attends meetings of clubs or organizations: Not on file    Relationship status: Not on file  Other Topics Concern  . Not on file  Social History Narrative  . Not on file   Additional Social History:    Pain Medications: Pt has a history of abusing opioids Prescriptions: Unknown Over the Counter: Unknown History of alcohol / drug use?: Yes Longest period of sobriety (when/how long): 3 months Name of Substance 1: Heroin 1 - Age of First Use: 37 1 - Amount (size/oz): 0.5-1 gram 1 - Frequency: Daily 1 - Duration: Ongoing 1 - Last Use / Amount: 09/19/18  Sleep: Good  Appetite:  Good  Current Medications: Current Facility-Administered Medications  Medication Dose Route Frequency Provider Last Rate Last Dose  . albuterol (PROVENTIL HFA;VENTOLIN HFA) 108 (90 Base) MCG/ACT inhaler 2 puff  2 puff Inhalation Q4H PRN Nira Conn A, NP   2 puff at 09/22/18 0608  . alum & mag hydroxide-simeth (MAALOX/MYLANTA) 200-200-20 MG/5ML suspension 30 mL  30 mL Oral Q4H PRN Nira Conn A, NP      . cloNIDine (CATAPRES) tablet 0.1 mg  0.1 mg Oral QID Nira Conn A, NP   0.1 mg at 09/22/18 0743   Followed by  . [START ON 09/23/2018] cloNIDine (CATAPRES) tablet 0.1 mg  0.1 mg Oral BH-qamhs Jackelyn Poling, NP       Followed by  . [START ON 09/25/2018] cloNIDine (CATAPRES) tablet 0.1 mg  0.1 mg Oral QAC breakfast Nira Conn A, NP      . dicyclomine (BENTYL) tablet 20 mg  20 mg Oral Q6H PRN Nira Conn A, NP      . feeding supplement (ENSURE ENLIVE) (ENSURE ENLIVE) liquid 237 mL  237 mL Oral BID BM Antonieta Pert, MD      . hydrOXYzine (ATARAX/VISTARIL) tablet 25 mg  25 mg Oral TID PRN Jackelyn Poling, NP   25 mg at 09/22/18 0742  . loperamide (IMODIUM) capsule 2-4 mg  2-4 mg Oral PRN Nira Conn A, NP      . LORazepam (ATIVAN) tablet 1 mg  1 mg Oral Once Nira Conn A, NP      . magnesium hydroxide (MILK OF MAGNESIA) suspension 30 mL  30 mL Oral Daily PRN Nira Conn A, NP      . methocarbamol (ROBAXIN) tablet 500 mg  500 mg Oral Q8H PRN Nira Conn A, NP   500 mg at  09/22/18 0742  . naproxen (NAPROSYN) tablet 500 mg  500 mg Oral BID PRN Nira ConnBerry, Jason A, NP   500 mg at 09/21/18 1837  . ondansetron (ZOFRAN-ODT) disintegrating tablet 4 mg  4 mg Oral Q6H PRN Nira ConnBerry, Jason A, NP   4 mg at 09/22/18 0553  . traZODone (DESYREL) tablet 50 mg  50 mg Oral QHS PRN Jackelyn PolingBerry, Jason A, NP   50 mg at 09/21/18 2103   Lab Results:  Results for orders placed or performed during the hospital encounter of 09/20/18 (from the past 48 hour(s))  Hemoglobin A1c     Status: None   Collection Time: 09/21/18  7:47 AM  Result Value Ref Range   Hgb A1c MFr Bld 4.8 4.8 - 5.6 %    Comment: (NOTE) Pre diabetes:          5.7%-6.4% Diabetes:              >6.4% Glycemic control for   <7.0% adults with diabetes    Mean Plasma Glucose 91.06 mg/dL    Comment: Performed at [facility], [address] [place], [place] [address]  Lipid panel     Status: Abnormal   Collection Time: 09/21/18  7:47 AM  Result Value Ref Range   Cholesterol 165 0 - 200 mg/dL   Triglycerides 63 <914<150 mg/dL   HDL 40 (L) >78>40 mg/dL   Total CHOL/HDL Ratio 4.1 RATIO   VLDL 13 0 - 40 mg/dL   LDL Cholesterol 295112 (H) 0 - 99 mg/dL    Comment:        Total Cholesterol/HDL:CHD Risk Coronary Heart Disease Risk Table                     Men   Women  1/2 Average Risk   3.4   3.3  Average Risk       5.0   4.4  2 X Average Risk   9.6   7.1  3 X Average Risk  23.4   11.0        Use the calculated Patient Ratio above and the CHD Risk Table to determine the patient's CHD Risk.        ATP III CLASSIFICATION (LDL):  <100     mg/dL    Optimal  [id number]  mg/dL   Near or Above                    Optimal  130-159  mg/dL   Borderline  [id number]  mg/dL   High  >962>190     mg/dL   Very High Performed at [facility], [address] [place], [place] [address]   TSH     Status: Abnormal   Collection Time: 09/21/18  7:47 AM  Result Value Ref Range   TSH 0.180 (L) 0.350 - 4.500 uIU/mL    Comment: Performed by a 3rd Generation assay with a functional sensitivity of <=0.01 uIU/mL. Performed at [facility], [address] [place], [place] [address]    Blood Alcohol level:  No results found for: [facility]  Metabolic Disorder Labs: Lab Results  Component Value Date   HGBA1C 4.8 09/21/2018   MPG 91.06 09/21/2018   No results found for: PROLACTIN Lab Results  Component Value Date   CHOL 165 09/21/2018   TRIG 63 09/21/2018   HDL 40 (L) 09/21/2018   CHOLHDL 4.1 09/21/2018   VLDL 13 09/21/2018   LDLCALC 112 (  H) 09/21/2018   Physical Findings: AIMS:  , ,  ,  ,    CIWA:    COWS:  COWS Total Score: 4  Musculoskeletal: Strength & Muscle Tone: within normal limits Gait & Station: normal Patient leans: N/A  Psychiatric Specialty Exam: Physical Exam  Nursing note and vitals reviewed.   Review of Systems  Respiratory: Negative.  Negative for cough and shortness of breath.   Cardiovascular: Negative.  Negative for chest pain and palpitations.  Gastrointestinal: Negative.  Negative for abdominal pain, heartburn, nausea and vomiting.  Neurological: Negative for dizziness and headaches.  Endo/Heme/Allergies: Negative.   Psychiatric/Behavioral: Positive for depression (Stable). Negative for suicidal ideas.    Blood pressure 107/72, pulse 81, temperature 98.2 F (36.8 C), resp. rate 20, height 5\' 11"  (1.803 m), weight 73.9 kg, SpO2 100 %.Body mass index is 22.73 kg/m.  General Appearance: Disheveled  Eye Contact:  Fair  Speech:  Normal Rate  Volume:  Decreased  Mood:  Dysphoric  Affect:   Congruent  Thought Process:  Coherent and Descriptions of Associations: Intact  Orientation:  Full (Time, Place, and Person)  Thought Content:  Logical  Suicidal Thoughts:  Yes.  without intent/plan  Homicidal Thoughts:  No  Memory:  Immediate;   Fair Recent;   Fair Remote;   Fair  Judgement:  Intact  Insight:  Lacking  Psychomotor Activity:  Normal  Concentration:  Concentration: Fair and Attention Span: Fair  Recall:  [employer] of Knowledge:  Fair  Language:  Fair  Akathisia:  Negative  Handed:  Right  AIMS (if indicated):     Assets:  Desire for Improvement Physical Health  ADL's:  Intact  Cognition:  WNL  Sleep:  Number of Hours: 6.75     Treatment Plan Summary: Daily contact with patient to assess and evaluate symptoms and progress in treatment and Medication management.  - Continue inpatient hospitalization.  - Will continue today 09/22/2018 plan as below except where it is noted.  Opioid detox.    - Continue the Clonidine detox protocols as already in progress.  Insomnia.    - Continue Trazodone 50 mg po Q hs prn.  Other medical issues.    - Continue the albuterol inhaler 2 puffs Q 4 hours prn.  Patient to attend & participate in the group counseling sessions.  Discharge disposition plan ongoing.  Armandina Stammer, NP, PMHNP, FNP-BC 09/22/2018, 10:57 AM

## 2018-09-22 NOTE — [facility] Group Notes (Signed)
[facility] Group Notes:  (Nursing)  Date:  09/22/2018  Time: 1:15 PM Type of Therapy:  Nurse Education  Participation Level:  Active  Participation Quality:  Appropriate and Attentive  Affect:  Appropriate  Cognitive:  Appropriate  Insight:  Appropriate  Engagement in Group:  Engaged  Modes of Intervention:  Education  Summary of Progress/Problems: Healthy Supports Group Shela Nevin 09/22/2018, 5:49 PM

## 2018-09-22 NOTE — Progress Notes (Signed)
Writer spoke with patient 1:1 and he c/o feeling restless all day. He reports trying to rest some today but was unable c/o muscles in lower back tightening whenever he laid down to rest. He reported that his sleep medication did not work but his sleep time recorded for last night showed differently. Writer received an order to increase trazodone to 100 mg with a repeat and see if this will work better for him. Support given and safety maintained on unit with 15 min checks.

## 2018-09-22 NOTE — [facility] Group Notes (Signed)
[facility] Group Notes: (Clinical Social Work)   09/22/2018      Type of Therapy:  Group Therapy   Participation Level:  Did Not Attend despite MHT prompting   Ambrose Mantle, LCSW 09/22/2018, 11:53 AM

## 2018-09-22 NOTE — Progress Notes (Signed)
D. Pt calm and cooperative- friendly upon approach- continues to continue of moderate withdrawal symptoms- has been visible on the unit interacting appropriately with peers and staff. Pt currently denies SI/HI and AVH and agrees to contact staff before acting on any harmful thoughts.  A. Labs and vitals monitored. Pt compliant with medications. Pt supported emotionally and encouraged to express concerns and ask questions.   R. Pt remains safe with 15 minute checks. Will continue POC.

## 2018-09-22 NOTE — Progress Notes (Signed)
Patient did attend the evening speaker AA meeting.  

## 2018-09-23 NOTE — Progress Notes (Signed)
[facility] referral made per pt request.   Ethel Rana. Alan Ripper, MSW, LCSW Clinical Social Worker 09/23/2018 9:53 AM

## 2018-09-23 NOTE — Progress Notes (Signed)
Recreation Therapy Notes  Date: 1.6.20 Time: 0930 Location: [address] Hall Dayroom  Group Topic: Stress Management  Goal Area(s) Addresses:  Patient will engage in healthy stress management techniques. Patient will be able to identify positive stress management techniques.  Behavioral Response: Engaged  Intervention: Stress Management  Activity : Meditation.  LRT introduced the stress management technique of meditation.  LRT played a meditation that focused on looking at each day as a new beginning.  Patients were to follow along as meditation was played in order to engage in activity.  Education:  Stress Management, Discharge Planning.   Education Outcome: Acknowledges Education  Clinical Observations/Feedback: Pt attended and participated in activity.    Job Holtsclaw, LRT/CTRS         Johnnay Pleitez A 09/23/2018 10:46 AM 

## 2018-09-23 NOTE — Progress Notes (Signed)
[facility]  09/23/2018 11:36 AM Edward Shaw  MRN:  [id number] Subjective:   Remains generally stable in mood and affect believes that his recovery is on track does not have any acute withdrawal symptoms or thoughts of self-harm.   No restless legs no thoughts of harming self or others no cramping or craving overall doing okay with regards to withdrawal symptoms however his focus is now longer-term stability and sobriety Principal Problem: Opioid type dependence, continuous (HCC) Diagnosis: Principal Problem:   Opioid type dependence, continuous (HCC) Active Problems:   Severe recurrent major depression without psychotic features (HCC)  Total Time spent with patient: 20 minutes Past Medical History:  Past Medical History:  Diagnosis Date  . Asthma    History reviewed. No pertinent surgical history. Family History:  Family History  Problem Relation Age of Onset  . Asthma Neg Hx    Social History:  Social History   Substance and Sexual Activity  Alcohol Use No  . Frequency: Never     Social History   Substance and Sexual Activity  Drug Use Yes  . Types: Heroin, Marijuana    Social History   Socioeconomic History  . Marital status: Single    Spouse name: Not on file  . Number of children: Not on file  . Years of education: Not on file  . Highest education level: Not on file  Occupational History  . Not on file  Social Needs  . Financial resource strain: Not on file  . Food insecurity:    Worry: Not on file    Inability: Not on file  . Transportation needs:    Medical: Not on file    Non-medical: Not on file  Tobacco Use  . Smoking status: Never Smoker  . Smokeless tobacco: Current User    Types: Chew  Substance and Sexual Activity  . Alcohol use: No    Frequency: Never  . Drug use: Yes    Types: Heroin, Marijuana  . Sexual activity: Not Currently  Lifestyle  . Physical activity:    Days per week: Not on file    Minutes per session: Not on  file  . Stress: Not on file  Relationships  . Social connections:    Talks on phone: Not on file    Gets together: Not on file    Attends religious service: Not on file    Active member of club or organization: Not on file    Attends meetings of clubs or organizations: Not on file    Relationship status: Not on file  Other Topics Concern  . Not on file  Social History Narrative  . Not on file   Additional Social History:    Pain Medications: Pt has a history of abusing opioids Prescriptions: Unknown Over the Counter: Unknown History of alcohol / drug use?: Yes Longest period of sobriety (when/how long): 3 months Name of Substance 1: Heroin 1 - Age of First Use: 37 1 - Amount (size/oz): 0.5-1 gram 1 - Frequency: Daily 1 - Duration: Ongoing 1 - Last Use / Amount: 09/19/18                  Sleep: Good  Appetite:  Good  Current Medications: Current Facility-Administered Medications  Medication Dose Route Frequency Provider Last Rate Last Dose  . albuterol (PROVENTIL HFA;VENTOLIN HFA) 108 (90 Base) MCG/ACT inhaler 2 puff  2 puff Inhalation Q4H PRN Nira Conn A, NP   2 puff at 09/23/18 0749  .  alum & mag hydroxide-simeth (MAALOX/MYLANTA) 200-200-20 MG/5ML suspension 30 mL  30 mL Oral Q4H PRN Nira ConnBerry, Jason A, NP      . cloNIDine (CATAPRES) tablet 0.1 mg  0.1 mg Oral BH-qamhs Jackelyn PolingBerry, Jason A, NP       Followed by  . [START ON 09/25/2018] cloNIDine (CATAPRES) tablet 0.1 mg  0.1 mg Oral QAC breakfast Nira ConnBerry, Jason A, NP   0.1 mg at 09/23/18 0746  . dicyclomine (BENTYL) tablet 20 mg  20 mg Oral Q6H PRN Nira ConnBerry, Jason A, NP      . feeding supplement (ENSURE ENLIVE) (ENSURE ENLIVE) liquid 237 mL  237 mL Oral BID BM Antonieta Pertlary, Greg Lawson, MD   237 mL at 09/22/18 1954  . hydrOXYzine (ATARAX/VISTARIL) tablet 25 mg  25 mg Oral TID PRN Jackelyn PolingBerry, Jason A, NP   25 mg at 09/23/18 0749  . loperamide (IMODIUM) capsule 2-4 mg  2-4 mg Oral PRN Nira ConnBerry, Jason A, NP      . LORazepam (ATIVAN) tablet 1 mg   1 mg Oral Once Nira ConnBerry, Jason A, NP      . magnesium hydroxide (MILK OF MAGNESIA) suspension 30 mL  30 mL Oral Daily PRN Nira ConnBerry, Jason A, NP      . methocarbamol (ROBAXIN) tablet 500 mg  500 mg Oral Q8H PRN Nira ConnBerry, Jason A, NP   500 mg at 09/23/18 0749  . naproxen (NAPROSYN) tablet 500 mg  500 mg Oral BID PRN Nira ConnBerry, Jason A, NP   500 mg at 09/22/18 2201  . ondansetron (ZOFRAN-ODT) disintegrating tablet 4 mg  4 mg Oral Q6H PRN Nira ConnBerry, Jason A, NP   4 mg at 09/23/18 1133  . traZODone (DESYREL) tablet 100 mg  100 mg Oral QHS,MR X 1 Nira ConnBerry, Jason A, NP   100 mg at 09/22/18 2247    Lab Results: No results found for this or any previous visit (from the past 48 hour(s)).  Blood Alcohol level:  [facility] results found for: [facility]  Metabolic Disorder Labs: Lab Results  Component Value Date   HGBA1C 4.8 09/21/2018   MPG 91.06 09/21/2018   No results found for: PROLACTIN Lab Results  Component Value Date   CHOL 165 09/21/2018   TRIG 63 09/21/2018   HDL 40 (L) 09/21/2018   CHOLHDL 4.1 09/21/2018   VLDL 13 09/21/2018   LDLCALC 112 (H) 09/21/2018    Physical Findings: AIMS:  , ,  ,  ,    CIWA:    COWS:  COWS Total Score: 2  Musculoskeletal: Strength & Muscle Tone: within normal limits Gait & Station: normal Patient leans: N/A  Psychiatric Specialty Exam: Physical Exam  ROS  Blood pressure 103/71, pulse 95, temperature 98.2 F (36.8 C), resp. rate 20, height 5\' 11"  (1.803 m), weight 73.9 kg, SpO2 100 %.Body mass index is 22.73 kg/m.  General Appearance: Casual  Eye Contact:  Good  Speech:  Clear and Coherent  Volume:  Normal  Mood:  Euphoric  Affect:  Congruent  Thought Process:  Goal Directed  Orientation:  Full (Time, Place, and Person)  Thought Content:  Tangential  Suicidal Thoughts:  No  Homicidal Thoughts:  No  Memory:  Immediate;   Good  Judgement:  Good  Insight:  Good  Psychomotor Activity:  Normal  Concentration:  Concentration: Good  Recall:  Good  Fund of Knowledge:  Good   Language:  Good  Akathisia:  Negative  Handed:  Right  AIMS (if indicated):     Assets:  Communication  Skills Desire for Improvement  ADL's:  Intact  Cognition:  WNL  Sleep:  Number of Hours: 5     Treatment Plan Summary: Daily contact with patient to assess and evaluate symptoms and progress in treatment, Medication management and Plan For chemical dependency issues continue groups continue cognitive-based and rehab based therapies for depressive symptoms continue therapy and current precautions will probably discharge tomorrow if stable  Edward Lincoln, MD 09/23/2018, 11:36 AM

## 2018-09-23 NOTE — Progress Notes (Signed)
Nursing Note: 0700-1900  D:  Pt presents with anxious mood and pleasant/silly affect.  States that he needs support to continue to stay on course.  "I know that I need people around me, like me to stay away from drugs. "[place] [facility], buy some [place]pe and then [employer] sells needles by 100's so I figured that I better keep going, I couldn't stop at that point."  Pt's goal for today is to "Stop thinking about dope."  Pt tends to ruminate on drug use and past experiences which are considered exhilarating to him.  States that he wants to stop but that the thrill and feeling overpowers his desire at times.   A:  Encouraged to verbalize needs and concerns, active listening and support provided.  Continued Q 15 minute safety checks.  Observed active participation in group settings.  R:  Pt. denies A/V hallucinations and is able to verbally contract for safety. Rates that depression and hopelessness are 0/10 while anxiety feels 4/10.

## 2018-09-23 NOTE — Tx Team (Signed)
Interdisciplinary Treatment and Diagnostic Plan Update  09/23/2018 Time of Session: [id number] Edward CurieDonnie [name] MRN: [id number]  Principal Diagnosis: Opioid type dependence, continuous (HCC)  Secondary Diagnoses: Principal Problem:   Opioid type dependence, continuous (HCC) Active Problems:   Severe recurrent major depression without psychotic features (HCC)   Current Medications:  Current Facility-Administered Medications  Medication Dose Route Frequency Provider Last Rate Last Dose  . albuterol (PROVENTIL HFA;VENTOLIN HFA) 108 (90 Base) MCG/ACT inhaler 2 puff  2 puff Inhalation Q4H PRN Nira ConnBerry, Jason A, NP   2 puff at 09/23/18 0749  . alum & mag hydroxide-simeth (MAALOX/MYLANTA) 200-200-20 MG/5ML suspension 30 mL  30 mL Oral Q4H PRN Nira ConnBerry, Jason A, NP      . cloNIDine (CATAPRES) tablet 0.1 mg  0.1 mg Oral BH-qamhs Jackelyn PolingBerry, Jason A, NP       Followed by  . [START ON 09/25/2018] cloNIDine (CATAPRES) tablet 0.1 mg  0.1 mg Oral QAC breakfast Nira ConnBerry, Jason A, NP   0.1 mg at 09/23/18 0746  . dicyclomine (BENTYL) tablet 20 mg  20 mg Oral Q6H PRN Nira ConnBerry, Jason A, NP      . feeding supplement (ENSURE ENLIVE) (ENSURE ENLIVE) liquid 237 mL  237 mL Oral BID BM Antonieta Pertlary, Greg Lawson, MD   237 mL at 09/22/18 1954  . hydrOXYzine (ATARAX/VISTARIL) tablet 25 mg  25 mg Oral TID PRN Jackelyn PolingBerry, Jason A, NP   25 mg at 09/23/18 0749  . loperamide (IMODIUM) capsule 2-4 mg  2-4 mg Oral PRN Nira ConnBerry, Jason A, NP      . LORazepam (ATIVAN) tablet 1 mg  1 mg Oral Once Nira ConnBerry, Jason A, NP      . magnesium hydroxide (MILK OF MAGNESIA) suspension 30 mL  30 mL Oral Daily PRN Nira ConnBerry, Jason A, NP      . methocarbamol (ROBAXIN) tablet 500 mg  500 mg Oral Q8H PRN Nira ConnBerry, Jason A, NP   500 mg at 09/23/18 0749  . naproxen (NAPROSYN) tablet 500 mg  500 mg Oral BID PRN Nira ConnBerry, Jason A, NP   500 mg at 09/22/18 2201  . ondansetron (ZOFRAN-ODT) disintegrating tablet 4 mg  4 mg Oral Q6H PRN Nira ConnBerry, Jason A, NP   4 mg at 09/23/18 0636  . traZODone  (DESYREL) tablet 100 mg  100 mg Oral QHS,MR X 1 Nira ConnBerry, Jason A, NP   100 mg at 09/22/18 2247   PTA Medications: Medications Prior to Admission  Medication Sig Dispense Refill Last Dose  . albuterol (PROVENTIL HFA;VENTOLIN HFA) 108 (90 Base) MCG/ACT inhaler Inhale 1-2 puffs into the lungs every 4 (four) hours as needed for wheezing or shortness of breath (use nebs first inpatient). 1 Inhaler 0   . albuterol (PROVENTIL) (2.5 MG/3ML) 0.083% nebulizer solution Take 3 mLs (2.5 mg total) by nebulization every 4 (four) hours as needed for wheezing or shortness of breath. (Patient not taking: Reported on 12/18/2017) 75 mL 12 Not Taking at Unknown time  . guaiFENesin-dextromethorphan (ROBITUSSIN DM) 100-10 MG/5ML syrup Take 5 mLs by mouth every 4 (four) hours as needed for cough. 118 mL 0     Patient Stressors: Marital or family conflict Substance abuse  Patient Strengths: Ability for insight Active sense of humor Average or above average intelligence  Treatment Modalities: Medication Management, Group therapy, Case management,  1 to 1 session with clinician, Psychoeducation, Recreational therapy.   Physician Treatment Plan for Primary Diagnosis: Opioid type dependence, continuous (HCC) Long Term Goal(s): Improvement in symptoms so as ready for discharge Improvement  in symptoms so as ready for discharge   Short Term Goals: Ability to identify changes in lifestyle to reduce recurrence of condition will improve Ability to verbalize feelings will improve Ability to identify changes in lifestyle to reduce recurrence of condition will improve Ability to verbalize feelings will improve Ability to identify and develop effective coping behaviors will improve Ability to maintain clinical measurements within normal limits will improve Ability to identify triggers associated with substance abuse/mental health issues will improve  Medication Management: Evaluate patient's response, side effects, and  tolerance of medication regimen.  Therapeutic Interventions: 1 to 1 sessions, Unit Group sessions and Medication administration.  Evaluation of Outcomes: Progressing  Physician Treatment Plan for Secondary Diagnosis: Principal Problem:   Opioid type dependence, continuous (HCC) Active Problems:   Severe recurrent major depression without psychotic features (HCC)  Long Term Goal(s): Improvement in symptoms so as ready for discharge Improvement in symptoms so as ready for discharge   Short Term Goals: Ability to identify changes in lifestyle to reduce recurrence of condition will improve Ability to verbalize feelings will improve Ability to identify changes in lifestyle to reduce recurrence of condition will improve Ability to verbalize feelings will improve Ability to identify and develop effective coping behaviors will improve Ability to maintain clinical measurements within normal limits will improve Ability to identify triggers associated with substance abuse/mental health issues will improve     Medication Management: Evaluate patient's response, side effects, and tolerance of medication regimen.  Therapeutic Interventions: 1 to 1 sessions, Unit Group sessions and Medication administration.  Evaluation of Outcomes: Progressing   RN Treatment Plan for Primary Diagnosis: Opioid type dependence, continuous (HCC) Long Term Goal(s): Knowledge of disease and therapeutic regimen to maintain health will improve  Short Term Goals: Ability to remain free from injury will improve, Ability to verbalize frustration and anger appropriately will improve, Ability to disclose and discuss suicidal ideas and Ability to identify and develop effective coping behaviors will improve  Medication Management: RN will administer medications as ordered by provider, will assess and evaluate patient's response and provide education to patient for prescribed medication. RN will report any adverse and/or side  effects to prescribing provider.  Therapeutic Interventions: 1 on 1 counseling sessions, Psychoeducation, Medication administration, Evaluate responses to treatment, Monitor vital signs and CBGs as ordered, Perform/monitor CIWA, COWS, AIMS and Fall Risk screenings as ordered, Perform wound care treatments as ordered.  Evaluation of Outcomes: Progressing   LCSW Treatment Plan for Primary Diagnosis: Opioid type dependence, continuous (HCC) Long Term Goal(s): Safe transition to appropriate next level of care at discharge, Engage patient in therapeutic group addressing interpersonal concerns.  Short Term Goals: Engage patient in aftercare planning with referrals and resources, Facilitate patient progression through stages of change regarding substance use diagnoses and concerns and Identify triggers associated with mental health/substance abuse issues  Therapeutic Interventions: Assess for all discharge needs, 1 to 1 time with Social worker, Explore available resources and support systems, Assess for adequacy in community support network, Educate family and significant other(s) on suicide prevention, Complete Psychosocial Assessment, Interpersonal group therapy.  Evaluation of Outcomes: Progressing   Progress in Treatment: Attending groups: Yes. Participating in groups: Yes. Taking medication as prescribed: Yes. Toleration medication: Yes. Family/Significant other contact made:SPE completed with pt; pt declined to consent to collateral contact.  Patient understands diagnosis: Yes. Discussing patient identified problems/goals with staff: Yes. Medical problems stabilized or resolved: Yes Denies suicidal/homicidal ideation: Yes Issues/concerns per patient self-inventory: No. Other: n/a  New problem(s) identified:  No, Describe:  n/a  New Short Term/Long Term Goal(s): detox, medication management for mood stabilization; elimination of SI thoughts; development of comprehensive mental  wellness/sobriety plan.   Patient Goals:  "to get help for my anxiety and substance abuse."   Discharge Plan or Barriers: CSW assessing. Referral made for [facility] per pt request--referral in review. [facility] pamphlet, Mobile Crisis information, and AA/NA information provided to patient for additional community support and resources.   Reason for Continuation of Hospitalization: Anxiety Depression Medication stabilization Withdrawal symptoms  Estimated Length of Stay: Wed, 09/25/18  Attendees: Patient: Edward Shaw 09/23/2018 9:55 AM  Physician: Dr. Jeannine Kitten MD 09/23/2018 9:55 AM  Nursing: Casimiro Needle RN; Velna Hatchet RN 09/23/2018 9:55 AM  RN Care Manager:x 09/23/2018 9:55 AM  Social Worker: Corrie Mckusick LCSW 09/23/2018 9:55 AM  Recreational Therapist: x 09/23/2018 9:55 AM  Other: Armandina Stammer NP; Marciano Sequin NP 09/23/2018 9:55 AM  Other:  09/23/2018 9:55 AM  Other: 09/23/2018 9:55 AM    Scribe for Treatment Team: Rona Ravens, LCSW 09/23/2018 9:55 AM

## 2018-09-23 NOTE — Progress Notes (Signed)
Patient did not attend AA group meeting. 

## 2018-09-24 MED ORDER — ENSURE ENLIVE PO LIQD: 237.0000 mL | Freq: Two times a day (BID) | ORAL | 12 refills | Status: DC

## 2018-09-24 MED ORDER — GABAPENTIN 300 MG PO CAPS: 300.0000 mg | ORAL_CAPSULE | Freq: Three times a day (TID) | ORAL | 0 refills | Status: AC

## 2018-09-24 MED ORDER — GABAPENTIN 600 MG PO TABS
300.0000 mg | ORAL_TABLET | Freq: Three times a day (TID) | ORAL | Status: DC
  Filled 2018-09-24 (×3): qty 0.5

## 2018-09-24 MED ORDER — ALBUTEROL SULFATE HFA 108 (90 BASE) MCG/ACT IN AERS: 1.0000 | INHALATION_SPRAY | RESPIRATORY_TRACT | 0 refills | Status: AC | PRN

## 2018-09-24 MED ORDER — TRAZODONE HCL 100 MG PO TABS: 100.0000 mg | ORAL_TABLET | Freq: Every evening | ORAL | 0 refills | Status: AC | PRN

## 2018-09-24 MED ORDER — HYDROXYZINE HCL 25 MG PO TABS: 25.0000 mg | ORAL_TABLET | Freq: Three times a day (TID) | ORAL | 0 refills | Status: AC | PRN

## 2018-09-24 MED ORDER — GABAPENTIN 300 MG PO CAPS: 300.0000 mg | ORAL_CAPSULE | Freq: Three times a day (TID) | ORAL | 0 refills | Status: DC

## 2018-09-24 MED ORDER — GABAPENTIN 300 MG PO CAPS
300.0000 mg | ORAL_CAPSULE | Freq: Three times a day (TID) | ORAL | Status: DC
  Filled 2018-09-24 (×3): qty 1

## 2018-09-24 NOTE — Plan of Care (Signed)
  Problem: Education: Goal: Knowledge of disease or condition will improve Outcome: Completed/Met Goal: Understanding of discharge needs will improve Outcome: Completed/Met   Problem: Health Behavior/Discharge Planning: Goal: Ability to identify changes in lifestyle to reduce recurrence of condition will improve Outcome: Completed/Met Goal: Identification of resources available to assist in meeting health care needs will improve Outcome: Completed/Met   Problem: Physical Regulation: Goal: Complications related to the disease process, condition or treatment will be avoided or minimized Outcome: Completed/Met   Problem: Safety: Goal: Ability to remain free from injury will improve Outcome: Completed/Met   Problem: Education: Goal: Ability to state activities that reduce stress will improve Outcome: Completed/Met   Problem: Coping: Goal: Ability to identify and develop effective coping behavior will improve Outcome: Completed/Met   Problem: Self-Concept: Goal: Ability to identify factors that promote anxiety will improve Outcome: Completed/Met Goal: Level of anxiety will decrease Outcome: Completed/Met Goal: Ability to modify response to factors that promote anxiety will improve Outcome: Completed/Met   Problem: Spiritual Needs Goal: Ability to function at adequate level Outcome: Completed/Met

## 2018-09-24 NOTE — Progress Notes (Signed)
  [facility] Adult Case Management Discharge Plan :  Will you be returning to the same living situation after discharge:  No, pt plans to enter [facility] at [facility].  At discharge, do you have transportation home?: Yes,  bus passes provided and $5 for [place] bus Do you have the ability to pay for your medications: Yes,  mental health  Release of information consent forms completed and submitted to medical records by CSW.   Patient to Follow up at: Follow-up Information    [facility] Follow up.   Specialty:  Addiction Medicine Why:  Referral faxed: 09/23/18. Per Shayla in admissions, you are currently in review. Please call daily to check status of referral. (You have been accepted to [facility] per [facility] for today).  Contact information: [address] [place] [address] [phone]           Next level of care provider has access to [facility] Link:no  Safety Planning and Suicide Prevention discussed: Yes,  SPE completed with pt; pt declined to consent to collateral contact.    Has patient been referred to the Quitline?: Patient refused referral  Patient has been referred for addiction treatment: Yes  Rona Ravens, LCSW 09/24/2018, 9:57 AM

## 2018-09-24 NOTE — Progress Notes (Signed)
Nursing discharge note: Patient discharged per MD order.  Patient received all personal belongings from unit and locker.  Reviewed AVS/transition record with patient and he indicates understanding.  Patient will follow up with [facility].  Patient denies any thoughts of self harm.  He left ambulatory with bus passes. Patient received packet with medication samples, bus passes, and prescriptions.

## 2018-09-24 NOTE — Progress Notes (Signed)
CSW met with pt to discuss aftercare. Pt is in review at [place] the director will have to make a final decision. Pt is also on waitlist for [facility]. Pt has been accepted to [facility] in order to wait for bed (up to 90 days) but has no transport there. Pt given bus pass and PART bus ticket to [facility]. Pt given $5 for bus ticket to [place], where his sponsor will pick him up and transport him to [facility] to Recovery location). Pt requesting to discharge by 11am in order to make bus on time. He declined additional referrals. Pt encouraged to follow-up with [facility] at discharge.   Thalia Turkington S. Ouida Sills, MSW, LCSW Clinical Social Worker 09/24/2018 9:57 AM

## 2018-09-24 NOTE — [facility] Suicide Risk Assessment (Signed)
[facility] Discharge Suicide Risk Assessment   Principal Problem: Opioid type dependence, continuous (HCC) Discharge Diagnoses: Principal Problem:   Opioid type dependence, continuous (HCC) Active Problems:   Severe recurrent major depression without psychotic features (HCC)   Total Time spent with patient: 30 minutes  Alert and oriented and cooperative without thoughts of self-harm contracting fully requesting Neurontin  Mental Status Per Nursing Assessment::   On Admission:  NA  Demographic Factors:  Male  Loss Factors: NA  Historical Factors: NA  Risk Reduction Factors:   Religious beliefs about death  Continued Clinical Symptoms:  Depression:   Severe  Cognitive Features That Contribute To Risk:  None    Suicide Risk:  Minimal: No identifiable suicidal ideation.  Patients presenting with no risk factors but with morbid ruminations; may be classified as minimal risk based on the severity of the depressive symptoms  Follow-up Information    [facility] Follow up.   Specialty:  Addiction Medicine Why:  Referral faxed: 09/23/18 Contact information: [address] [place] [address] [phone]           Plan Of Care/Follow-up recommendations:  Activity:  full  Erick Murin, MD 09/24/2018, 9:33 AM

## 2018-09-24 NOTE — [facility] Suicide Risk Assessment (Signed)
[facility] INPATIENT:  Family/Significant Other Suicide Prevention Education  Suicide Prevention Education:  Patient Refusal for Family/Significant Other Suicide Prevention Education: The patient Edward Shaw has refused to provide written consent for family/significant other to be provided Family/Significant Other Suicide Prevention Education during admission and/or prior to discharge.  Physician notified.  SPE completed with pt, as pt refused to consent to family contact. [facility] pamphlet provided to pt and pt was encouraged to share information with support network, ask questions, and talk about any concerns relating to SPE. Pt denies access to guns/firearms and verbalized understanding of information provided. Mobile Crisis information also provided to pt.   Rona Ravens LCSW 09/24/2018, 9:49 AM

## 2018-09-24 NOTE — Discharge Summary (Signed)
Physician Discharge Summary Note  Patient:  Edward Shaw is an 43 y.o., male MRN:  [id number] DOB:  08-Nov-1975 Patient phone:  [phone] (home)  Patient address:   [address] [place] [address],  Total Time spent with patient: 15 minutes  Date of Admission:  09/20/2018 Date of Discharge: 09/24/2018  Reason for Admission:  Suicidal ideation with heroin withdrawal  Principal Problem: Opioid type dependence, continuous (HCC) Discharge Diagnoses: Principal Problem:   Opioid type dependence, continuous (HCC) Active Problems:   Severe recurrent major depression without psychotic features [facility])   Past Psychiatric History: See admission H&P  Past Medical History:  Past Medical History:  Diagnosis Date  . Asthma    History reviewed. No pertinent surgical history. Family History:  Family History  Problem Relation Age of Onset  . Asthma Neg Hx    Family Psychiatric  History: See admission H&P Social History:  Social History   Substance and Sexual Activity  Alcohol Use No  . Frequency: Never     Social History   Substance and Sexual Activity  Drug Use Yes  . Types: Heroin, Marijuana    Social History   Socioeconomic History  . Marital status: Single    Spouse name: Not on file  . Number of children: Not on file  . Years of education: Not on file  . Highest education level: Not on file  Occupational History  . Not on file  Social Needs  . Financial resource strain: Not on file  . Food insecurity:    Worry: Not on file    Inability: Not on file  . Transportation needs:    Medical: Not on file    Non-medical: Not on file  Tobacco Use  . Smoking status: Never Smoker  . Smokeless tobacco: Current User    Types: Chew  Substance and Sexual Activity  . Alcohol use: No    Frequency: Never  . Drug use: Yes    Types: Heroin, Marijuana  . Sexual activity: Not Currently  Lifestyle  . Physical activity:    Days per week: Not on file    Minutes per  session: Not on file  . Stress: Not on file  Relationships  . Social connections:    Talks on phone: Not on file    Gets together: Not on file    Attends religious service: Not on file    Active member of club or organization: Not on file    Attends meetings of clubs or organizations: Not on file    Relationship status: Not on file  Other Topics Concern  . Not on file  Social History Narrative  . Not on file    Hospital Course:  From admission H&P 09/21/2018: Edward Pol Smithis an 43 y.o.[facility]at [facility] whois a heroin addict andis homeless. He presented in the ED today with suicidal ideation. He states that he would be better off dead. Patient is depressed and sad. No specific plan of how he would harm himself at admission to ED, but later told MD that he would inject "some sort of substance" to kill himself. Patient states that he has been addicted to heroin for awhile and he accepted this lifestyle as being normal for some time, but states that he decided that he needed to get off of it because of what his use was doing to his family. Patient states that he has not used anything in the past twenty-four hours and states that he has become increasingly  depressed. Patient states that he has been crying, sad and experiencing suicidal thoughts. Patient states that this is the fist time that he has ever been suicidal. Patient states that the more withdrawal symptoms that he experiences the more depressed that he gets and thinks "I don't want to be here." Patient states that he has never had any psychiatric care on an inpatient or an outpatient basis in the past, but states that he [facility] and states that he had some clean time, but relapsed. Patient states that he is not experiencing any appetite issues, but states that he has a difficult time sleeping due to the withdrawal and his depression. Patient states that he has this  time he feels loke he is in a different state of mind that is more mental this time than related to his substance use. Patient states, "I don't want to be alone, I know that sounds crazy." Patient is scared of what he might do. Patient denies HI/Psychosis. Patient states that he started using heroin at the age of 43. he states that he is currently using 1/2 gram to 1 gram daily. He denies any use of other drugs or alcohol.   Edward Shaw was admitted for suicidal ideation in the context of heroin withdrawal. [facility]. He stabilized with medication and therapy. Medication for asthma was continued. He [facility]. He was discharged on gabapentin, trazodone, and Vistaril. He has shown improvement with improved mood, affect, sleep, appetite, and interaction. He denies any SI/HI/AVH and contracts for safety. [facility] (see info below). He is provided with prescriptions for medications upon discharge.  Physical Findings: AIMS:  , ,  ,  ,    CIWA:    COWS:  COWS Total Score: 5  Musculoskeletal: Strength & Muscle Tone: within normal limits Gait & Station: normal Patient leans: N/A  Psychiatric Specialty Exam: Physical Exam  Nursing note and vitals reviewed. Constitutional: He is oriented to person, place, and time. He appears well-developed and well-nourished.  Cardiovascular: Normal rate.  Respiratory: Effort normal.  Neurological: He is alert and oriented to person, place, and time.    Review of Systems  Constitutional: Negative.   Respiratory: Negative.   Cardiovascular: Negative.   Gastrointestinal: Negative.   Psychiatric/Behavioral: Positive for depression (improving) and substance abuse (hx heroin). Negative for hallucinations, memory loss and suicidal ideas. The patient is not nervous/anxious and does not have insomnia.     Blood pressure 102/67, pulse (!) 103, temperature 97.8 F (36.6 C),  resp. rate 20, height 5\' 11"  (1.803 m), weight 73.9 kg, SpO2 99 %.Body mass index is 22.73 kg/m.  General Appearance: Casual  Eye Contact:  Good  Speech:  Clear and Coherent  Volume:  Normal  Mood:  Euthymic  Affect:  Congruent  Thought Process:  Coherent  Orientation:  Full (Time, Place, and Person)  Thought Content:  WDL  Suicidal Thoughts:  No  Homicidal Thoughts:  No  Memory:  Immediate;   Good  Judgement:  Fair  Insight:  Fair  Psychomotor Activity:  Normal  Concentration:  Concentration: Good  Recall:  Good  Fund of Knowledge:  Fair  Language:  Good  Akathisia:  No  Handed:  Right  AIMS (if indicated):     Assets:  Communication Skills Desire for Improvement  ADL's:  Intact  Cognition:  WNL  Sleep:  Number of Hours: 6        Has this patient used any form of tobacco in the last 30 days? (Cigarettes, Smokeless Tobacco, Cigars, and/or Pipes)  No  Blood Alcohol level:  No results found for: [facility]  Metabolic Disorder Labs:  Lab Results  Component Value Date   HGBA1C 4.8 09/21/2018   MPG 91.06 09/21/2018   No results found for: PROLACTIN Lab Results  Component Value Date   CHOL 165 09/21/2018   TRIG 63 09/21/2018   HDL 40 (L) 09/21/2018   CHOLHDL 4.1 09/21/2018   VLDL 13 09/21/2018   LDLCALC 112 (H) 09/21/2018    See Psychiatric Specialty Exam and Suicide Risk Assessment completed by Attending Physician prior to discharge.  Discharge destination:  Home  Is patient on multiple antipsychotic therapies at discharge:  No   Has Patient had three or more failed trials of antipsychotic monotherapy by history:  No  Recommended Plan for Multiple Antipsychotic Therapies: NA  Discharge Instructions    Discharge instructions   Complete by:  As directed    Patient is instructed to take all prescribed medications as recommended. Report any side effects or adverse reactions to your outpatient psychiatrist. Patient is instructed to abstain from alcohol and illegal  drugs while on prescription medications. In the event of worsening symptoms, patient is instructed to call the crisis hotline, 911, or go to the nearest emergency department for evaluation and treatment.     Allergies as of 09/24/2018      Reactions   Penicillins    CHILDHOOD ALLERGY Has patient had a PCN reaction causing immediate rash, facial/tongue/throat swelling, SOB or lightheadedness with hypotension: {/Unknown Has patient had a PCN reaction causing severe rash involving mucus membranes or skin necrosis: {Unknown: Has patient had a PCN reaction that required hospitalization: Unknown Has patient had a PCN reaction occurring within the last 10 years: Unknown If all of the above answers are "NO", then may proceed with Cephalosporin use.   Tylenol [acetaminophen]       Medication List    STOP taking these medications   guaiFENesin-dextromethorphan 100-10 MG/5ML syrup Commonly known as:  ROBITUSSIN DM     TAKE these medications     Indication  albuterol 108 (90 Base) MCG/ACT inhaler Commonly known as:  PROVENTIL HFA;VENTOLIN HFA Inhale 1-2 puffs into the lungs every 4 (four) hours as needed for wheezing or shortness of breath (use nebs first inpatient). What changed:  Another medication with the same name was removed. Continue taking this medication, and follow the directions you see here.  Indication:  Shortness of breath   gabapentin 300 MG capsule Commonly known as:  NEURONTIN Take 1 capsule (300 mg total) by mouth 3 (three) times daily. For anxiety/agitation  Indication:  Anxiety/Agitation   hydrOXYzine 25 MG tablet Commonly known as:  ATARAX/VISTARIL Take 1 tablet (25 mg total) by mouth 3 (three) times daily as needed for anxiety.  Indication:  Feeling Anxious   traZODone 100 MG tablet Commonly known as:  DESYREL Take 1 tablet (100 mg total) by mouth at bedtime and may repeat dose one time if needed. For sleep  Indication:  Trouble Sleeping      Follow-up  Information    [facility] Follow up.   Specialty:  Addiction Medicine Why:  Referral faxed: 09/23/18. Per Shayla in admissions, you are currently in review. Please call daily to check status of referral. ([facility] for  today).  Contact information: [address] [place] [id number] [phone]           Follow-up recommendations: Activity as tolerated. Diet as recommended by primary care physician. Keep all scheduled follow-up appointments as recommended.   Comments:   Patient is instructed to take all prescribed medications as recommended. Report any side effects or adverse reactions to your outpatient psychiatrist. Patient is instructed to abstain from alcohol and illegal drugs while on prescription medications. In the event of worsening symptoms, patient is instructed to call the crisis hotline, 911, or go to the nearest emergency department for evaluation and treatment.  Signed: Aldean BakerJanet E Jerald Villalona, NP 09/24/2018, 3:00 PM

## 2018-09-24 NOTE — Progress Notes (Signed)
D: Patient denies SI, HI or AVH. Patient presents as anxious but pleasant on approach.  States that he had a good day and has a good appetite and sleep.  Pt. Is visualized in the dayroom interacting with staff and his peers.  Pt. Did not attend the AA group.   A: Patient given emotional support from RN. Patient encouraged to come to staff with concerns and/or questions. Patient's medication routine continued. Patient's orders and plan of care reviewed.   R: Patient remains appropriate and cooperative. Will continue to monitor patient q15 minutes for safety.

## 2019-01-04 ENCOUNTER — Emergency Department (HOSPITAL_COMMUNITY)
Admission: EM | Admit: 2019-01-04 | Discharge: 2019-01-04 | Disposition: A | Discharge status: Home / Self Care | Payer: Self-pay | Attending: Emergency Medicine | Admitting: Emergency Medicine

## 2019-01-04 ENCOUNTER — Other Ambulatory Visit: Payer: Self-pay

## 2019-01-04 ENCOUNTER — Encounter (HOSPITAL_COMMUNITY): Payer: Self-pay | Admitting: Emergency Medicine

## 2019-01-04 DIAGNOSIS — J4520 Mild intermittent asthma, uncomplicated: Secondary | ICD-10-CM | POA: Insufficient documentation

## 2019-01-04 DIAGNOSIS — Z760 Encounter for issue of repeat prescription: Secondary | ICD-10-CM | POA: Insufficient documentation

## 2019-01-04 DIAGNOSIS — Z79899 Other long term (current) drug therapy: Secondary | ICD-10-CM | POA: Insufficient documentation

## 2019-01-04 DIAGNOSIS — F17220 Nicotine dependence, chewing tobacco, uncomplicated: Secondary | ICD-10-CM | POA: Insufficient documentation

## 2019-01-04 MED ORDER — HYDROXYZINE HCL 25 MG PO TABS: 25.0000 mg | ORAL_TABLET | Freq: Three times a day (TID) | ORAL | 0 refills | Status: AC | PRN

## 2019-01-04 MED ORDER — ALBUTEROL SULFATE HFA 108 (90 BASE) MCG/ACT IN AERS
2.0000 | INHALATION_SPRAY | Freq: Once | RESPIRATORY_TRACT | Status: AC
  Administered 2019-01-04: 2 via RESPIRATORY_TRACT
  Filled 2019-01-04: qty 6.7

## 2019-01-04 MED ORDER — ALBUTEROL SULFATE HFA 108 (90 BASE) MCG/ACT IN AERS: 1.0000 | INHALATION_SPRAY | RESPIRATORY_TRACT | 0 refills | Status: AC | PRN

## 2019-01-04 NOTE — ED Notes (Signed)
ED Provider at bedside. 

## 2019-01-04 NOTE — ED Provider Notes (Signed)
[facility]-EMERGENCY DEPT Provider Note   CSN: [id number] Arrival date & time: 01/04/19  1135    History   Chief Complaint Chief Complaint  Patient presents with  . Medication Refill    HPI    Edward Shaw is a 43 y.o. male with a PMHx of asthma, who presents to the ED with complaints of needing a return to work note as well as an inhaler refill.  Patient has a history of asthma, has run out of his inhaler.  Around 5 AM this morning he was meditating and started to have some mild wheezing and shortness of breath.  He states that he continued to meditate and do breathing exercises and this resolved his symptoms.  He feels fine now however his job needed a note stating that he was okay to return to work.  He did not take anything because he does not have anything at home.  No known aggravating factors.  He states he feels completely better at this time.  He previously was under the care of Dr. Lorin Picket in [place] however he has moved to [facility] now and does not have a PCP at this time.  He denies any recent URI symptoms, cough, fevers, chills, chest pain, ongoing shortness of breath, ongoing wheezing, or any other complaints at this time.  The history is provided by the patient and medical records. No language interpreter was used.  Medication Refill    Past Medical History:  Diagnosis Date  . Asthma     Patient Active Problem List   Diagnosis Date Noted  . Opioid type dependence, continuous (HCC) 09/22/2018  . Severe recurrent major depression without psychotic features (HCC) 09/21/2018  . Acute respiratory failure with hypoxia (HCC) 10/15/2017  . Asthma exacerbation 10/15/2017    History reviewed. No pertinent surgical history.      Home Medications    Prior to Admission medications   Medication Sig Start Date End Date Taking? Authorizing Provider  albuterol (PROVENTIL HFA;VENTOLIN HFA) 108 (90 Base) MCG/ACT inhaler Inhale 1-2 puffs into the  lungs every 4 (four) hours as needed for wheezing or shortness of breath (use nebs first inpatient). 09/24/18   Aldean Baker, NP  gabapentin (NEURONTIN) 300 MG capsule Take 1 capsule (300 mg total) by mouth 3 (three) times daily. For anxiety/agitation 09/24/18   Aldean Baker, NP  hydrOXYzine (ATARAX/VISTARIL) 25 MG tablet Take 1 tablet (25 mg total) by mouth 3 (three) times daily as needed for anxiety. 09/24/18   Aldean Baker, NP  traZODone (DESYREL) 100 MG tablet Take 1 tablet (100 mg total) by mouth at bedtime and may repeat dose one time if needed. For sleep 09/24/18   Aldean Baker, NP    Family History Family History  Problem Relation Age of Onset  . Asthma Neg Hx     Social History Social History   Tobacco Use  . Smoking status: Never Smoker  . Smokeless tobacco: Current User    Types: Chew  Substance Use Topics  . Alcohol use: No    Frequency: Never  . Drug use: Yes    Types: Heroin, Marijuana     Allergies   Penicillins and Tylenol [acetaminophen]   Review of Systems Review of Systems  Constitutional: Negative for chills and fever.  HENT: Negative for rhinorrhea and sore throat.   Respiratory: Negative for cough, shortness of breath (none ongoing) and wheezing (none ongoing).   Cardiovascular: Negative for chest pain.  Allergic/Immunologic: Negative for  immunocompromised state.     Physical Exam Updated Vital Signs BP 129/75 (BP Location: Left Arm)   Pulse 79   Temp 98.4 F (36.9 C) (Oral)   Resp 18   SpO2 100%   Physical Exam Vitals signs and nursing note reviewed.  Constitutional:      General: He is not in acute distress.    Appearance: Normal appearance. He is well-developed. He is not toxic-appearing.     Comments: Afebrile, nontoxic, NAD  HENT:     Head: Normocephalic and atraumatic.  Eyes:     General:        Right eye: No discharge.        Left eye: No discharge.     Conjunctiva/sclera: Conjunctivae normal.  Neck:     Musculoskeletal:  Normal range of motion and neck supple.  Cardiovascular:     Rate and Rhythm: Normal rate and regular rhythm.     Pulses: Normal pulses.     Heart sounds: Normal heart sounds, S1 normal and S2 normal. No murmur. No friction rub. No gallop.   Pulmonary:     Effort: Pulmonary effort is normal. No respiratory distress.     Breath sounds: Normal breath sounds and air entry. No decreased breath sounds, wheezing, rhonchi or rales.     Comments: CTAB in all lung fields, no w/r/r, no hypoxia or increased WOB, speaking in full sentences, SpO2 100% on RA  Abdominal:     General: There is no distension.  Musculoskeletal: Normal range of motion.  Skin:    General: Skin is warm and dry.     Findings: No rash.  Neurological:     Mental Status: He is alert and oriented to person, place, and time.     Sensory: Sensation is intact. No sensory deficit.     Motor: Motor function is intact.  Psychiatric:        Mood and Affect: Mood and affect normal.        Behavior: Behavior normal.      ED Treatments / Results  Labs (all labs ordered are listed, but only abnormal results are displayed) Labs Reviewed - No data to display  EKG None  Radiology No results found.  Procedures Procedures (including critical care time)  Medications Ordered in ED Medications  albuterol (VENTOLIN HFA) 108 (90 Base) MCG/ACT inhaler 2 puff (2 puffs Inhalation Given 01/04/19 1222)     Initial Impression / Assessment and Plan / ED Course  I have reviewed the triage vital signs and the nursing notes.  Pertinent labs & imaging results that were available during my care of the patient were reviewed by me and considered in my medical decision making (see chart for details).        43 y.o. male here needing a medication refill and a work note.  Patient states that he has history of asthma, has run out of his inhaler, had a mild asthma exacerbation earlier this morning where he had some wheezing and shortness of  breath, but he was able to meditate and perform breathing exercises and feels fine now.  Needs a return to work form.  No other symptoms.  On exam, clear lung exam, no tachycardia or hypoxia, no increased work of breathing, well-appearing.  Doubt need for further emergent intervention or work-up at this time.  Will provide refill of inhaler as well as Vistaril which he states has helped in the past with his allergy/asthma symptoms.  Patient no longer has a PCP  [place], will provide information regarding [facility] to establish medical care and follow-up in 1 week.  Strict return precautions discussed. I explained the diagnosis and have given explicit precautions to return to the ER including for any other new or worsening symptoms. The patient understands and accepts the medical plan as it's been dictated and I have answered their questions. Discharge instructions concerning home care and prescriptions have been given. The patient is STABLE and is discharged to home in good condition.    Final Clinical Impressions(s) / ED Diagnoses   Final diagnoses:  Encounter for medication refill  Mild intermittent asthma without complication    ED Discharge Orders         Ordered    albuterol (VENTOLIN HFA) 108 (90 Base) MCG/ACT inhaler  Every 4 hours PRN     01/04/19 1204    hydrOXYzine (ATARAX/VISTARIL) 25 MG tablet  Every 8 hours PRN     01/04/19 [id number]           Giovana Faciane, [place]iftonMercedes, [place] JerseyPA-C 01/04/19 1224    Mancel BaleWentz, [place], MD 01/05/19 [phone]

## 2019-01-04 NOTE — ED Triage Notes (Signed)
Patient reports missing work due to asthma attack earlier. Requesting work note and inhaler refill. Denies cough, SOB, fevers.

## 2019-01-04 NOTE — ED Notes (Signed)
Bed: WTR9 Expected date:  Expected time:  Means of arrival:  Comments: 

## 2019-01-04 NOTE — Discharge Instructions (Addendum)
Continue to stay well-hydrated. Use Mucinex for cough suppression/expectoration of mucus. Use vistaril as directed as needed to decrease symptoms and frequency of asthma attacks. Use inhaler as directed, as needed for cough/chest congestion/wheezing/shortness of breath/etc. Follow-up with the [place] [facility] in 5-7 days for recheck of ongoing symptoms and to establish medical care for future medication refills. Return to emergency department for emergent changing or worsening of symptoms.

## 2019-01-21 DIAGNOSIS — F1990 Other psychoactive substance use, unspecified, uncomplicated: Secondary | ICD-10-CM

## 2019-01-21 DIAGNOSIS — A400 Sepsis due to streptococcus, group A: Secondary | ICD-10-CM

## 2019-01-21 DIAGNOSIS — L03113 Cellulitis of right upper limb: Secondary | ICD-10-CM

## 2019-01-21 DIAGNOSIS — A419 Sepsis, unspecified organism: Secondary | ICD-10-CM

## 2019-01-22 DIAGNOSIS — R740 Nonspecific elevation of levels of transaminase and lactic acid dehydrogenase [LDH]: Secondary | ICD-10-CM

## 2019-01-22 DIAGNOSIS — L02413 Cutaneous abscess of right upper limb: Secondary | ICD-10-CM

## 2019-01-22 DIAGNOSIS — J45909 Unspecified asthma, uncomplicated: Secondary | ICD-10-CM

## 2019-01-22 DIAGNOSIS — M609 Myositis, unspecified: Secondary | ICD-10-CM
